# Patient Record
Sex: Male | Born: 1940
Health system: Southern US, Community
[De-identification: ages and names within clinical notes are randomized; demographics above are authoritative.]

## PROBLEM LIST (undated history)

## (undated) DIAGNOSIS — J309 Allergic rhinitis, unspecified: Secondary | ICD-10-CM

## (undated) DIAGNOSIS — E785 Hyperlipidemia, unspecified: Secondary | ICD-10-CM

## (undated) DIAGNOSIS — H269 Unspecified cataract: Secondary | ICD-10-CM

## (undated) DIAGNOSIS — K649 Unspecified hemorrhoids: Secondary | ICD-10-CM

## (undated) DIAGNOSIS — G4700 Insomnia, unspecified: Secondary | ICD-10-CM

## (undated) HISTORY — PX: CHOLECYSTECTOMY: SHX55

## (undated) HISTORY — DX: Unspecified hemorrhoids: K64.9

## (undated) HISTORY — DX: Allergic rhinitis, unspecified: J30.9

## (undated) HISTORY — PX: ROTATOR CUFF REPAIR: SHX139

## (undated) HISTORY — DX: Insomnia, unspecified: G47.00

## (undated) HISTORY — DX: Hyperlipidemia, unspecified: E78.5

## (undated) HISTORY — DX: Unspecified cataract: H26.9

---

## 2006-03-26 ENCOUNTER — Encounter: Admission: RE | Admit: 2006-03-26 | Discharge: 2006-03-26 | Payer: Self-pay | Admitting: Gastroenterology

## 2007-08-23 ENCOUNTER — Ambulatory Visit (HOSPITAL_COMMUNITY): Admission: RE | Admit: 2007-08-23 | Discharge: 2007-08-23 | Payer: Self-pay | Admitting: Gastroenterology

## 2007-09-14 ENCOUNTER — Ambulatory Visit (HOSPITAL_COMMUNITY): Admission: RE | Admit: 2007-09-14 | Discharge: 2007-09-14 | Payer: Self-pay | Admitting: Gastroenterology

## 2010-03-06 ENCOUNTER — Inpatient Hospital Stay (HOSPITAL_COMMUNITY): Admission: EM | Admit: 2010-03-06 | Discharge: 2010-03-09 | Payer: Self-pay | Source: Home / Self Care

## 2010-03-07 ENCOUNTER — Encounter (INDEPENDENT_AMBULATORY_CARE_PROVIDER_SITE_OTHER): Payer: Self-pay

## 2010-04-01 ENCOUNTER — Emergency Department (HOSPITAL_COMMUNITY)
Admission: EM | Admit: 2010-04-01 | Discharge: 2010-04-01 | Payer: Self-pay | Source: Home / Self Care | Admitting: Emergency Medicine

## 2010-04-07 LAB — BASIC METABOLIC PANEL
BUN: 13 mg/dL (ref 6–23)
CO2: 24 mEq/L (ref 19–32)
Calcium: 9.2 mg/dL (ref 8.4–10.5)
Chloride: 104 mEq/L (ref 96–112)
Creatinine, Ser: 1.02 mg/dL (ref 0.4–1.5)
GFR calc Af Amer: >60 mL/min (ref >60)
GFR calc non Af Amer: >60 mL/min (ref >60)
Glucose, Bld: 117 mg/dL — ABNORMAL HIGH (ref 70–99)
Potassium: 3.8 mEq/L (ref 3.5–5.1)
Sodium: 138 mEq/L (ref 135–145)

## 2010-04-07 LAB — DIFFERENTIAL
Basophils Absolute: 0.1 10*3/uL (ref 0.0–0.1)
Basophils Relative: 1 % (ref 0–1)
Eosinophils Absolute: 0.5 10*3/uL (ref 0.0–0.7)
Eosinophils Relative: 6 % — ABNORMAL HIGH (ref 0–5)
Lymphocytes Relative: 23 % (ref 12–46)
Lymphs Abs: 1.8 10*3/uL (ref 0.7–4.0)
Monocytes Absolute: 0.4 10*3/uL (ref 0.1–1.0)
Monocytes Relative: 5 % (ref 3–12)
Neutro Abs: 4.9 10*3/uL (ref 1.7–7.7)
Neutrophils Relative %: 64 % (ref 43–77)

## 2010-04-07 LAB — CBC
HCT: 39.4 % (ref 39.0–52.0)
Hemoglobin: 13.4 g/dL (ref 13.0–17.0)
MCH: 30.9 pg (ref 26.0–34.0)
MCHC: 34 g/dL (ref 30.0–36.0)
MCV: 90.8 fL (ref 78.0–100.0)
Platelets: 296 10*3/uL (ref 150–400)
RBC: 4.34 MIL/uL (ref 4.22–5.81)
RDW: 12.9 % (ref 11.5–15.5)
WBC: 7.6 10*3/uL (ref 4.0–10.5)

## 2010-04-17 ENCOUNTER — Encounter
Admission: RE | Admit: 2010-04-17 | Discharge: 2010-04-17 | Payer: Self-pay | Source: Home / Self Care | Attending: Gastroenterology | Admitting: Gastroenterology

## 2010-05-19 ENCOUNTER — Ambulatory Visit (HOSPITAL_COMMUNITY)
Admission: RE | Admit: 2010-05-19 | Discharge: 2010-05-19 | Disposition: A | Discharge status: Home / Self Care | Payer: Medicare Other | Source: Ambulatory Visit | Attending: Gastroenterology | Admitting: Gastroenterology

## 2010-05-19 DIAGNOSIS — R1310 Dysphagia, unspecified: Secondary | ICD-10-CM | POA: Insufficient documentation

## 2010-05-19 DIAGNOSIS — K219 Gastro-esophageal reflux disease without esophagitis: Secondary | ICD-10-CM | POA: Insufficient documentation

## 2010-06-02 LAB — COMPREHENSIVE METABOLIC PANEL
ALT: 53 U/L (ref 0–53)
AST: 44 U/L — ABNORMAL HIGH (ref 0–37)
Albumin: 3.3 g/dL — ABNORMAL LOW (ref 3.5–5.2)
Alkaline Phosphatase: 120 U/L — ABNORMAL HIGH (ref 39–117)
BUN: 8 mg/dL (ref 6–23)
CO2: 25 mEq/L (ref 19–32)
Calcium: 8.6 mg/dL (ref 8.4–10.5)
Chloride: 100 mEq/L (ref 96–112)
Creatinine, Ser: 1.03 mg/dL (ref 0.4–1.5)
GFR calc Af Amer: >60 mL/min (ref >60)
GFR calc non Af Amer: >60 mL/min (ref >60)
Glucose, Bld: 142 mg/dL — ABNORMAL HIGH (ref 70–99)
Potassium: 3.9 mEq/L (ref 3.5–5.1)
Sodium: 133 mEq/L — ABNORMAL LOW (ref 135–145)
Total Bilirubin: 1.1 mg/dL (ref 0.3–1.2)
Total Protein: 6.4 g/dL (ref 6.0–8.3)

## 2010-06-02 LAB — DIFFERENTIAL
Basophils Absolute: 0 10*3/uL (ref 0.0–0.1)
Basophils Relative: 0 % (ref 0–1)
Eosinophils Absolute: 0.1 10*3/uL (ref 0.0–0.7)
Eosinophils Relative: 1 % (ref 0–5)
Lymphocytes Relative: 8 % — ABNORMAL LOW (ref 12–46)
Lymphs Abs: 0.9 10*3/uL (ref 0.7–4.0)
Monocytes Absolute: 0.6 10*3/uL (ref 0.1–1.0)
Monocytes Relative: 6 % (ref 3–12)
Neutro Abs: 8.7 10*3/uL — ABNORMAL HIGH (ref 1.7–7.7)
Neutrophils Relative %: 84 % — ABNORMAL HIGH (ref 43–77)

## 2010-06-02 LAB — CBC
HCT: 37.6 % — ABNORMAL LOW (ref 39.0–52.0)
HCT: 38.4 % — ABNORMAL LOW (ref 39.0–52.0)
Hemoglobin: 12.9 g/dL — ABNORMAL LOW (ref 13.0–17.0)
Hemoglobin: 13.2 g/dL (ref 13.0–17.0)
MCH: 31.1 pg (ref 26.0–34.0)
MCH: 31.2 pg (ref 26.0–34.0)
MCHC: 34.3 g/dL (ref 30.0–36.0)
MCHC: 34.4 g/dL (ref 30.0–36.0)
MCV: 90.6 fL (ref 78.0–100.0)
MCV: 90.8 fL (ref 78.0–100.0)
Platelets: 240 10*3/uL (ref 150–400)
Platelets: 248 10*3/uL (ref 150–400)
RBC: 4.15 MIL/uL — ABNORMAL LOW (ref 4.22–5.81)
RBC: 4.23 MIL/uL (ref 4.22–5.81)
RDW: 12.7 % (ref 11.5–15.5)
RDW: 12.9 % (ref 11.5–15.5)
WBC: 10.3 10*3/uL (ref 4.0–10.5)
WBC: 11.8 10*3/uL — ABNORMAL HIGH (ref 4.0–10.5)

## 2010-06-02 LAB — POCT I-STAT, CHEM 8
BUN: 11 mg/dL (ref 6–23)
Calcium, Ion: 1.04 mmol/L — ABNORMAL LOW (ref 1.12–1.32)
Chloride: 99 mEq/L (ref 96–112)
Creatinine, Ser: 1.1 mg/dL (ref 0.4–1.5)
Glucose, Bld: 141 mg/dL — ABNORMAL HIGH (ref 70–99)
HCT: 41 % (ref 39.0–52.0)
Hemoglobin: 13.9 g/dL (ref 13.0–17.0)
Potassium: 3.9 mEq/L (ref 3.5–5.1)
Sodium: 133 mEq/L — ABNORMAL LOW (ref 135–145)
TCO2: 26 mmol/L (ref 0–100)

## 2010-06-02 LAB — COMPREHENSIVE METABOLIC PANEL WITH GFR
ALT: 50 U/L (ref 0–53)
AST: 42 U/L — ABNORMAL HIGH (ref 0–37)
Albumin: 3.2 g/dL — ABNORMAL LOW (ref 3.5–5.2)
Alkaline Phosphatase: 129 U/L — ABNORMAL HIGH (ref 39–117)
BUN: 7 mg/dL (ref 6–23)
CO2: 26 meq/L (ref 19–32)
Calcium: 8.8 mg/dL (ref 8.4–10.5)
Chloride: 99 meq/L (ref 96–112)
Creatinine, Ser: 0.87 mg/dL (ref 0.4–1.5)
GFR calc non Af Amer: >60 mL/min
Glucose, Bld: 169 mg/dL — ABNORMAL HIGH (ref 70–99)
Potassium: 3.8 meq/L (ref 3.5–5.1)
Sodium: 134 meq/L — ABNORMAL LOW (ref 135–145)
Total Bilirubin: 0.8 mg/dL (ref 0.3–1.2)
Total Protein: 6.3 g/dL (ref 6.0–8.3)

## 2010-06-02 LAB — AMYLASE: Amylase: 56 U/L (ref 0–105)

## 2010-06-02 LAB — LIPASE, BLOOD
Lipase: 17 U/L (ref 11–59)
Lipase: 17 U/L (ref 11–59)

## 2010-06-17 NOTE — Op Note (Signed)
  NAMEHELIX, LAFONTAINE NO.:  [id number]  MEDICAL RECORD NO.:  [id number]           PATIENT TYPE:  O  LOCATION:  WLEN                         FACILITY:  [facility]  PHYSICIAN:  Petra Kuba, M.D.    DATE OF BIRTH:  04-Oct-1940  DATE OF PROCEDURE:  05/19/2010 DATE OF DISCHARGE:  05/19/2010                              OPERATIVE REPORT   PROCEDURE:  The manometry was done by the endoscopy nurse and the computer generated report was given to me for my interpretation.  HISTORY:  Patient with history of hiatal hernia and reflux, contemplating hiatal hernia repair.  MANOMETRY RESULTS: 1. Upper esophageal sphincter did have slight increased pressure and     slight residual pressure, but the pharyngeal pressure and waveforms     were fine. 2. Esophageal body had normal waveforms, normal amplitude, normal     duration and normal velocity with 100% peristalsis and propagated     waves. 3. Lower esophageal sphincter had normal pressures and normal percent     relaxation as well as normal residual pressures.  IMPRESSION:  Okay for surgical options.  PLAN:  Per Dr. Dwain Sarna.  Happy to see back p.r.n. and consider just treating medically and holding off on surgery but will leave to the patient and surgeon.          ______________________________ Petra Kuba, M.D.     MEM/MEDQ  D:  05/28/2010  T:  05/29/2010  Job:  [id number]  cc:   Juanetta Gosling, MD [address] [place] [address]  Electronically Signed by Vida Rigger M.D. on 06/17/2010 01:22:03 PM

## 2010-08-05 NOTE — Op Note (Signed)
Marcus Hopkins, Marcus Hopkins NO.:  [id number]   MEDICAL RECORD NO.:  [id number]          PATIENT TYPE:  AMB   LOCATION:  ENDO                         FACILITY:  [facility]   PHYSICIAN:  Danise Edge, M.D.   DATE OF BIRTH:  Oct 20, 1940   DATE OF PROCEDURE:  09/14/2007  DATE OF DISCHARGE:                               OPERATIVE REPORT   REFERRING PHYSICIAN:  Thora Lance, MD   PROCEDURE INDICATION:  Mr. Marcus Hopkins is a 70 year old male born  November 20, 1940.  On August 23, 2007, Mr. Marcus Hopkins swallowed a piece of  meat which lodged in his distal esophagus.  The obstructing meat bolus  was removed with the Roth net.  The distal esophagus was severely  inflamed as a result of the foreign body.  Mr. Marcus Hopkins has been on a  proton pump inhibitor and returns today for esophageal dilation.   MEDICATION ALLERGIES:  None.   CHRONIC MEDICATIONS:  None.   PAST MEDICAL - SURGICAL HISTORY:  Allergic rhinitis.   FAMILY HISTORY:  Negative for colon cancer.   HABITS:  Mr. Marcus Hopkins quit smoking cigarettes years ago.  He consumes  alcohol in moderation.   ENDOSCOPY:  Danise Edge, MD   PREMEDICATION:  1. Fentanyl 75 mcg.  2. Versed 9 mg.   PROCEDURE:  After obtaining informed consent, Mr. Marcus Hopkins was placed in  the left lateral decubitus position on the fluoroscopy table.  I  administered intravenous fentanyl and intravenous Versed to achieve  conscious sedation for the procedure.  The patient's blood pressure,  oxygen saturation and cardiac rhythm were monitored throughout the  procedure and documented in the medical record.   The Pentax gastroscope was passed through the posterior hypopharynx into  the proximal esophagus without difficulty.  The hypopharynx, larynx and  vocal cords appeared normal.   Esophagoscopy:  The proximal, mid and lower segments of the esophageal  mucosa appear normal except for a shallow benign stricture at the  esophagogastric junction which is  noted at approximately 44 cm from the  incisor teeth.  There is no endoscopic evidence for the presence of  Barrett's esophagus, erosive esophagitis or esophageal tumor.   Gastroscopy:  Retroflex view of the gastric cardia and fundus was  normal.  I do not detect a hiatal hernia.  The gastric body, antrum and  pylorus appear completely normal.   Duodenoscopy:  The duodenal bulb and descending duodenum appeared  normal.   Savary esophageal dilation:  The Savary dilator wire was passed through  the Pentax gastroscope and the tip of the guidewire advanced to the  distal gastric antrum.  Fluoroscopy was not required for esophageal  dilation.  The 15-mm Savary dilator was passed without resistance over  the Savary guidewire.  Repeat esophagogastroscopy following Savary  esophageal dilation reveals satisfactory dilation of the benign peptic  stricture at the esophagogastric junction and no gastric trauma due to  the guidewire.   ASSESSMENT:  Shallow benign peptic stricture at the esophagogastric  junction dilated with the 15-mm Savary dilator.  Otherwise, normal  esophagogastroduodenoscopy.  ______________________________  Danise Edge, M.D.     MJ/MEDQ  D:  09/14/2007  T:  09/15/2007  Job:  [id number]   cc:   Thora Lance, M.D.

## 2010-08-05 NOTE — Op Note (Signed)
[name], [name] NO.:  [id number]   MEDICAL RECORD NO.:  [id number]          PATIENT TYPE:  AMB   LOCATION:  ENDO                         FACILITY:  [facility]   PHYSICIAN:  Danise Edge, M.D.   DATE OF BIRTH:  [birth year]   DATE OF PROCEDURE:  08/23/2007  DATE OF DISCHARGE:                               OPERATIVE REPORT   REFERRING PHYSICIAN:  Thora Lance, MD.   PROCEDURE INDICATIONS:  Marcus Hopkins is a 70 year old male, born  on 05-19-1940.  Marcus Hopkins was consuming a steak tender last  night and a piece of meat became lodged in the esophagus.  He was able  to regurgitate part of the obstructing meat bolus.  He has not had any  food were drained and having the obstruction developed.  He feels as  though there is residual food lodged in the esophagus.   In January 2008, his barium swallow with barium tablet was normal except  for a nonobstructing cervical esophageal ring.   ENDOSCOPIST:  Danise Edge, MD   PREMEDICATION:  Fentanyl 100 mcg and Versed 7.5 mg.   PROCEDURE:  After obtaining informed consent, Marcus Hopkins was placed in  the left lateral decubitus position on the fluoroscopy table.  I  administered intravenous fentanyl and intravenous Versed to achieve  conscious sedation for the procedure.  The patient's blood pressure,  oxygen saturation, and cardiac rhythm were monitored throughout the  procedure and documented in the medical record.   The Pentax gastroscope was passed through the posterior hypopharynx into  the proximal esophagus without difficulty.  The hypopharynx, larynx, and  vocal cords appeared normal.   Esophagoscopy:  The proximal and mid segments of the esophagus appeared  normal.  There was meat completely obstructing the distal esophagus at  the esophagogastric junction.  The meat was pulled out of the  esophagogastric junction using the Roth net and removed through the  mouth with the gastroscope.  The Pentax  gastroscope was then passed  through the posterior hypopharynx into the esophagus without difficulty.  The proximal mid and lower segments of the esophagus appeared normal  except for a shallow Schatzki ring at the esophagogastric junction (40  cm from the incisor teeth) associated with severe mucosal friability  where the meat had caused the esophageal obstruction.   Gastroscopy:  Retroflex view of the gastric cardia and fundus was  normal.  The gastric body, antrum, and pylorus appeared normal.   Duodenoscopy:  The duodenal bulb and descending duodenum appeared  normal.   ASSESSMENT:  Meat bolus obstructing the distal esophagus; the  obstructing food was removed with the Roth met.  Shallow Schatzki ring  at the esophagogastric junction.  Severe distal esophageal mucosal  friability due to injury from the obstructing meat bolus.   PLAN:  I will allow the esophagus to heal and perform an elective  esophageal dilation in the next week or two.           ______________________________  Danise Edge, M.D.     MJ/MEDQ  D:  08/23/2007  T:  08/24/2007  Job:  [id number]   cc:   Thora Lance, M.D.

## 2011-11-29 ENCOUNTER — Ambulatory Visit (HOSPITAL_COMMUNITY)
Admission: EM | Admit: 2011-11-29 | Discharge: 2011-11-29 | Disposition: A | Discharge status: Home / Self Care | Payer: Medicare Other | Attending: Emergency Medicine | Admitting: Emergency Medicine

## 2011-11-29 ENCOUNTER — Encounter (HOSPITAL_COMMUNITY): Payer: Self-pay | Admitting: Emergency Medicine

## 2011-11-29 ENCOUNTER — Encounter (HOSPITAL_COMMUNITY)
Admission: EM | Disposition: A | Discharge status: Home / Self Care | Payer: Self-pay | Source: Home / Self Care | Attending: Emergency Medicine

## 2011-11-29 DIAGNOSIS — R6889 Other general symptoms and signs: Secondary | ICD-10-CM | POA: Insufficient documentation

## 2011-11-29 DIAGNOSIS — IMO0002 Reserved for concepts with insufficient information to code with codable children: Secondary | ICD-10-CM | POA: Insufficient documentation

## 2011-11-29 DIAGNOSIS — T18108A Unspecified foreign body in esophagus causing other injury, initial encounter: Secondary | ICD-10-CM | POA: Insufficient documentation

## 2011-11-29 DIAGNOSIS — R1310 Dysphagia, unspecified: Secondary | ICD-10-CM | POA: Insufficient documentation

## 2011-11-29 DIAGNOSIS — K117 Disturbances of salivary secretion: Secondary | ICD-10-CM | POA: Insufficient documentation

## 2011-11-29 HISTORY — PX: ESOPHAGOGASTRODUODENOSCOPY: SHX5428

## 2011-11-29 SURGERY — EGD (ESOPHAGOGASTRODUODENOSCOPY)
Anesthesia: Moderate Sedation

## 2011-11-29 MED ORDER — GLUCAGON HCL (RDNA) 1 MG IJ SOLR
INTRAMUSCULAR | Status: DC | PRN
  Administered 2011-11-29: .5 mg via INTRAVENOUS

## 2011-11-29 MED ORDER — BUTAMBEN-TETRACAINE-BENZOCAINE 2-2-14 % EX AERO
INHALATION_SPRAY | CUTANEOUS | Status: DC | PRN
  Administered 2011-11-29: 1 via TOPICAL

## 2011-11-29 MED ORDER — GLUCAGON HCL (RDNA) 1 MG IJ SOLR
1.0000 mg | Freq: Once | INTRAMUSCULAR | Status: AC
  Administered 2011-11-29: 1 mg via INTRAVENOUS
  Filled 2011-11-29: qty 1

## 2011-11-29 MED ORDER — MIDAZOLAM HCL 10 MG/2ML IJ SOLN
INTRAMUSCULAR | Status: DC | PRN
  Administered 2011-11-29 (×2): 2 mg via INTRAVENOUS

## 2011-11-29 MED ORDER — SODIUM CHLORIDE 0.9 % IV SOLN: INTRAVENOUS | Status: DC

## 2011-11-29 MED ORDER — SODIUM CHLORIDE 0.9 % IV SOLN
INTRAVENOUS | Status: DC
  Administered 2011-11-29: 04:00:00 via INTRAVENOUS

## 2011-11-29 MED ORDER — FENTANYL CITRATE 0.05 MG/ML IJ SOLN
INTRAMUSCULAR | Status: DC | PRN
  Administered 2011-11-29: 50 ug via INTRAVENOUS
  Administered 2011-11-29: 25 ug via INTRAVENOUS

## 2011-11-29 NOTE — H&P (Signed)
[facility] Consult Note  Consulting Physician:  Dr. Dierdre Highman (ED)  Chief Complaint: sialorrhea, dysphagia, suspected esophageal food impaction.  HPI: Marcus Hopkins is an 71 y.o. male presenting with dysphagia, sialorrhea, and sensation of steak stuck in his esophagus.  Has happened couple times in past.  Reportedly has had dilatation, and has had couple endoscopies for food impactions.    History reviewed. No pertinent past medical history.  Past Surgical History  Procedure Date  . Cholecystectomy     No prescriptions prior to admission    Allergies: No Known Allergies  No family history on file.  Social History:  reports that he has never smoked. He does not have any smokeless tobacco history on file. He reports that he does not drink alcohol. His drug history not on file.   ROS: As per HPI, all others negative  Blood pressure 130/85, pulse 75, temperature 98 F (36.7 C), resp. rate 17, height 5\' 9"  (1.753 m), weight 74.844 kg (165 lb), SpO2 97.00%. General appearance: NAD, some sialorrhea HEENT:  Anicteric Chest:  No crepitus or tenderness ABD:  Soft Neuro:  Alert + oriented  No results found for this or any previous visit (from the past 48 hour(s)). No results found.  Assessment:  1.  Suspected esophageal food impaction.  Plan: 1.  Upper endoscopy with likely foreign body extraction. 2.  Risks (bleeding, infection, bowel perforation that could require surgery, sedation-related changes in cardiopulmonary systems), benefits (identification and possible treatment of source of symptoms, exclusion of certain causes of symptoms), and alternatives (watchful waiting, radiographic imaging studies, empiric medical treatment) of upper endoscopy (EGD) were explained to patient in detail and he wishes to proceed.  [name] 11/29/2011, 5:11 AM

## 2011-11-29 NOTE — ED Notes (Signed)
Pt alert, arrives from home, c/o "steak stuck" in throat, onset this evening, hx of same, no stridor noted, tolerating oral secretions well

## 2011-11-29 NOTE — ED Notes (Signed)
Received patient back from Endoscopy. Patient alert and oriented. Awaiting arrival of spouse for transportation home.

## 2011-11-29 NOTE — ED Notes (Signed)
Patient transported to Endoscopy

## 2011-11-29 NOTE — ED Notes (Signed)
[id number]<MV> Expected date:<BR> Expected time:<BR> Means of arrival:<BR> Comments:<BR> Patient is in ENDO

## 2011-11-29 NOTE — Brief Op Note (Signed)
EGD done; report in Endopro.  Food impaction seen and endoscopically removed.  Ok to discharge home.  We will arrange outpatient follow-up.

## 2011-11-29 NOTE — Op Note (Signed)
[facility] [address] [place], [address]   ENDOSCOPY PROCEDURE REPORT  PATIENT: Marcus Hopkins, Marcus Hopkins  MR#: [id number] BIRTHDATE: January 02, 1941 , 70  yrs. old GENDER: Male ENDOSCOPIST: Willis Modena, MD REFERRED BY:  Kirby Funk, M.D. PROCEDURE DATE:  11/29/2011 PROCEDURE:  EGD w/ fb removal ASA CLASS:     Class I INDICATIONS:  foreign body removal from esophagus.   dysphagia. sialorrhea. MEDICATIONS: Glucagon 0.5 mg IV, Fentanyl 75 mcg IV, and Versed 5 mg IV TOPICAL ANESTHETIC: Cetacaine Spray  DESCRIPTION OF PROCEDURE: After the risks benefits and alternatives of the procedure were thoroughly explained, informed consent was obtained.  The Pentax Gastroscope [id number] endoscope was introduced through the mouth and advanced to the second portion of the duodenum. Without limitations.  The instrument was slowly withdrawn as the mucosa was fully examined.     Findings: Food bolus seen wedged in distal esophagus.  Large portion of bolus was grasped with talon grasper and removed via mouth.  The remainder of the bolus, small, was gently nudged into the stomach. There was a muscular-appearing ring in the distal esopahgus; appearance not obvious of Schatzki's ring; no mucosal features to suggest eosinophilic esophagitis were identified.  Limited endoscopy of stomach, pylorus and duodenum to the second portion was normal.              The scope was then withdrawn from the patient and the procedure completed.  ENDOSCOPIC IMPRESSION:     As above.  RECOMMENDATIONS:     1.  Watch for potential complications of procedure. 2.  Continue Prilosec OTC 20 mg once-a-day. 3.  Avoid fibrous meats, fibrous/raw vegetables and fibrous breads for the next couple weeks. 4.  Will need repeat endoscopy + esophageal dilatation in the next couple weeks, either by myself or by Dr. Ewing Schlein, both of whom have now seen patient for esophageal food impactions.  eSigned:  Willis Modena, MD  11/29/2011 5:47 AM   CC:

## 2011-11-29 NOTE — ED Provider Notes (Signed)
History     CSN: [id number]  Arrival date & time 11/29/11  0205   First MD Initiated Contact with Patient 11/29/11 0315      Chief Complaint  Patient presents with  . Airway Obstruction    (Consider location/radiation/quality/duration/timing/severity/associated sxs/prior treatment) HPI HX per PT, feel slike something stuck in his esophagus, h/o same in the past with stricture requiring dilation, followed by GI, [facility].  Tonight eating steak and feels stuck, unable to swallow liquids and spitting up saliva. Mild epigastric discomfort, no SOB. No F/C> mod in severity. H/o same.  History reviewed. No pertinent past medical history.  Past Surgical History  Procedure Date  . Cholecystectomy   . Rotator cuff repair     No family history on file.  History  Substance Use Topics  . Smoking status: Never Smoker   . Smokeless tobacco: Not on file  . Alcohol Use: No      Review of Systems  Constitutional: Negative for fever and chills.  HENT: Negative for neck pain and neck stiffness.   Eyes: Negative for pain.  Respiratory: Negative for shortness of breath.   Cardiovascular: Negative for chest pain.  Gastrointestinal: Negative for abdominal distention.  Genitourinary: Negative for dysuria.  Musculoskeletal: Negative for back pain.  Skin: Negative for rash.  Neurological: Negative for headaches.  All other systems reviewed and are negative.    Allergies  Review of patient's allergies indicates no known allergies.  Home Medications  No current outpatient prescriptions on file.  BP 123/77  Pulse 75  Temp 98 F (36.7 C)  Resp 20  Ht 5\' 9"  (1.753 m)  Wt 165 lb (74.844 kg)  BMI 24.37 kg/m2  SpO2 95%  Physical Exam  Constitutional: He is oriented to person, place, and time. He appears well-developed and well-nourished.  HENT:  Head: Normocephalic and atraumatic.  Eyes: Conjunctivae and EOM are normal. Pupils are equal, round, and reactive to light.  Neck: Trachea  normal. Neck supple. No thyromegaly present.  Cardiovascular: Normal rate, regular rhythm, S1 normal, S2 normal and normal pulses.     No systolic murmur is present   No diastolic murmur is present  Pulses:      Radial pulses are 2+ on the right side, and 2+ on the left side.  Pulmonary/Chest: Effort normal and breath sounds normal. He has no wheezes. He has no rhonchi. He has no rales. He exhibits no tenderness.  Abdominal: Soft. Normal appearance and bowel sounds are normal. There is no tenderness. There is no CVA tenderness and negative Murphy's sign.  Musculoskeletal:       BLE:s Calves nontender, no cords or erythema, negative Homans sign  Neurological: He is alert and oriented to person, place, and time. He has normal strength. No cranial nerve deficit or sensory deficit. GCS eye subscore is 4. GCS verbal subscore is 5. GCS motor subscore is 6.  Skin: Skin is warm and dry. No rash noted. He is not diaphoretic.  Psychiatric: His speech is normal.       Cooperative and appropriate    ED Course  Procedures (including critical care time)  IVfs and IV glucagton  D/w Dr Dulce Sellar, GI, who presented to the ED, was able to resolve food bolus and PT sent home in the care of his wife. Has GI f/u [facility].   6:15 AM feeling much better. Stable for d/c home  MDM   VS, nursing notes and old records reviewed. GI c/s and food bolus resolved.  IVFs.         Sunnie Nielsen, MD 11/29/11 [id number]

## 2011-11-29 NOTE — ED Notes (Signed)
Patient attempted to drink water. Attempt unsuccessful. Patient regurgitated water. States that he can still feel food bolus.

## 2011-11-30 ENCOUNTER — Encounter (HOSPITAL_COMMUNITY): Payer: Self-pay | Admitting: Gastroenterology

## 2013-10-15 ENCOUNTER — Encounter (HOSPITAL_COMMUNITY): Payer: Self-pay | Admitting: Emergency Medicine

## 2013-10-15 ENCOUNTER — Emergency Department (HOSPITAL_COMMUNITY)
Admission: EM | Admit: 2013-10-15 | Discharge: 2013-10-15 | Disposition: A | Discharge status: Home / Self Care | Payer: Medicare PPO | Source: Home / Self Care

## 2013-10-15 DIAGNOSIS — S46912A Strain of unspecified muscle, fascia and tendon at shoulder and upper arm level, left arm, initial encounter: Secondary | ICD-10-CM

## 2013-10-15 DIAGNOSIS — S46212A Strain of muscle, fascia and tendon of other parts of biceps, left arm, initial encounter: Secondary | ICD-10-CM

## 2013-10-15 DIAGNOSIS — X58XXXA Exposure to other specified factors, initial encounter: Secondary | ICD-10-CM

## 2013-10-15 DIAGNOSIS — S43499A Other sprain of unspecified shoulder joint, initial encounter: Secondary | ICD-10-CM

## 2013-10-15 DIAGNOSIS — M7522 Bicipital tendinitis, left shoulder: Secondary | ICD-10-CM

## 2013-10-15 DIAGNOSIS — IMO0002 Reserved for concepts with insufficient information to code with codable children: Secondary | ICD-10-CM

## 2013-10-15 DIAGNOSIS — M7520 Bicipital tendinitis, unspecified shoulder: Secondary | ICD-10-CM

## 2013-10-15 DIAGNOSIS — S46819A Strain of other muscles, fascia and tendons at shoulder and upper arm level, unspecified arm, initial encounter: Secondary | ICD-10-CM

## 2013-10-15 MED ORDER — DICLOFENAC POTASSIUM 50 MG PO TABS: 50.0000 mg | ORAL_TABLET | Freq: Three times a day (TID) | ORAL | Status: DC

## 2013-10-15 NOTE — ED Notes (Signed)
Pt  States  He  Was  Lifting  A  Person  Up  And  Felt  A          Pain in left  forearm and  Elbow  Area          No  Obvious  Deformity        rom is  Present         Hand   Grip   Mod    Cap refill is  Brisk     No other  injurys

## 2013-10-15 NOTE — ED Provider Notes (Signed)
CSN: [id number]     Arrival date & time 10/15/13  1342 History   First MD Initiated Contact with Patient 10/15/13 1444     Chief Complaint  Patient presents with  . Arm Injury   (Consider location/radiation/quality/duration/timing/severity/associated sxs/prior Treatment) HPI Comments: 73 y o m lifting his mother in law felt a pop and severe pain in the L arm at the A/C fossa approx 2 hours before being seen.  Points to the A/C aspect , distal bicep and proximal brachioradialis.    History reviewed. No pertinent past medical history. Past Surgical History  Procedure Laterality Date  . Cholecystectomy    . Rotator cuff repair    . Esophagogastroduodenoscopy  11/29/2011    Procedure: ESOPHAGOGASTRODUODENOSCOPY (EGD);  Surgeon: Arta Silence, MD;  Location: [facility];  Service: Endoscopy;  Laterality: N/A;   History reviewed. No pertinent family history. History  Substance Use Topics  . Smoking status: Never Smoker   . Smokeless tobacco: Not on file  . Alcohol Use: No    Review of Systems  Constitutional: Negative.   Respiratory: Negative.   Gastrointestinal: Negative.   Genitourinary: Negative.   Musculoskeletal: Negative for back pain, neck pain and neck stiffness.       As per HPI  Skin: Negative.   Neurological: Negative for dizziness, weakness, numbness and headaches.    Allergies  Review of patient's allergies indicates no known allergies.  Home Medications   Prior to Admission medications   Medication Sig Start Date End Date Taking? Authorizing Provider  diclofenac (CATAFLAM) 50 MG tablet Take 1 tablet (50 mg total) by mouth 3 (three) times daily. One tablet TID with food prn pain. 10/15/13   Janne Napoleon, NP   BP 138/96  Pulse 63  Temp(Src) 98.7 F (37.1 C) (Oral)  SpO2 96% Physical Exam  Nursing note and vitals reviewed. Constitutional: He is oriented to person, place, and time. He appears well-developed and well-nourished. No distress.  HENT:  Head:  Normocephalic and atraumatic.  Eyes: EOM are normal. Left eye exhibits no discharge.  Neck: Normal range of motion. Neck supple.  Pulmonary/Chest: Effort normal. No respiratory distress.  Musculoskeletal:  No deformity. Minor swelling distal bicep tendons and brachioradialis. No asymmetry. Bicep muscle intact and with minor tenderness.  Nl strength with flexion of bicep, pronation, suppination, grip, wrist ROM. Distal N/V, M/S intact.Pule 2+, nl color and warmth.  Neurological: He is alert and oriented to person, place, and time. No cranial nerve deficit.  Skin: Skin is warm and dry.  Psychiatric: He has a normal mood and affect.    ED Course  Procedures (including critical care time) Labs Review Labs Reviewed - No data to display  Imaging Review No results found.   MDM   1. Biceps strain, left, initial encounter   2. Biceps tendinitis, left   3. Muscle strain of upper arm, left, initial encounter     Wear your sling for 2-3 days, remove periodically for UE movement ICe as directed' [facility] as discussed.    Janne Napoleon, NP 10/15/13 [id number]

## 2013-10-15 NOTE — Discharge Instructions (Signed)
Bicipital Tendonitis Bicipital tendonitis refers to redness, soreness, and swelling (inflammation) or irritation of the bicep tendon. The biceps muscle is located between the elbow and shoulder of the inner arm. The tendon heads, similar to pieces of rope, connect the bicep muscle to the shoulder socket. They are called short head and long head tendons. When tendonitis occurs, the long head tendon is inflamed and swollen, and may be thickened or partially torn.  Bicipital tendonitis can occur with other problems as well, such as arthritis in the shoulder or acromioclavicular joints, tears in the tendons, or other rotator cuff problems.  CAUSES  Overuse of of the arms for overhead activities is the major cause of tendonitis. Many athletes, such as swimmers, baseball players, and tennis players are prone to bicipital tendonitis. Jobs that require manual labor or routine chores, especially chores involving overhead activities can result in overuse and tendonitis. SYMPTOMS Symptoms may include:  Pain in and around the front of the shoulder. Pain may be worse with overhead motion.  Pain or aching that radiates down the arm.  Clicking or shifting sensations in the shoulder. DIAGNOSIS Your caregiver may perform the following:  Physical exam and tests of the biceps and shoulder to observe range of motion, strength, and stability.  X-rays or magnetic resonance imaging (MRI) to confirm the diagnosis. In most common cases, these tests are not necessary. Since other problems may exist in the shoulder or rotator cuff, additional tests may be recommended. TREATMENT Treatment may include the following:  Medications  Your caregiver may prescribe over-the-counter pain relievers.  Steroid injections, such as cortisone, may be recommended. These may help to reduce inflammation and pain.  Physical Therapy - Your caregiver may recommend gentle exercises with the arm. These can help restore strength and range  of motion. They may be done at home or with a physical therapist's supervision and input.  Surgery - Arthroscopic or open surgery sometimes is necessary. Surgery may include:  Reattachment or repair of the tendon at the shoulder socket.  Removal of the damaged section of the tendon.  Anchoring the tendon to a different area of the shoulder (tenodesis). HOME CARE INSTRUCTIONS   Avoid overhead motion of the affected arm or any other motion that causes pain.  Take medication for pain as directed. Do not take these for more than 3 weeks, unless directed to do so by your caregiver.  Ice the affected area for 20 minutes at a time, 3-4 times per day. Place a towel on the skin over the painful area and the ice or cold pack over the towel. Do not place ice directly on the skin.  Perform gentle exercises at home as directed. These will increase strength and flexibility. PREVENTION  Modify your activities as much as possible to protect your arm. A physical therapist or sports medicine physician can help you understand options for safe motion.  Avoid repetitive overhead pulling, lifting, reaching, and throwing until your caregiver tells you it is ok to resume these activities. SEEK MEDICAL CARE IF:  Your pain worsens.  You have difficulty moving the affected arm.  You have trouble performing any of the self-care instructions. MAKE SURE YOU:   Understand these instructions.  Will watch your condition.  Will get help right away if you are not doing well or get worse. Document Released: 04/11/2010 Document Revised: 06/01/2011 Document Reviewed: 04/11/2010 [facility] Patient Information 2015 [place], [place]. This information is not intended to replace advice given to you by your health care provider.  Make sure you discuss any questions you have with your health care provider.  Muscle Strain A muscle strain is an injury that occurs when a muscle is stretched beyond its normal length. Usually a  small number of muscle fibers are torn when this happens. Muscle strain is rated in degrees. First-degree strains have the least amount of muscle fiber tearing and pain. Second-degree and third-degree strains have increasingly more tearing and pain.  Usually, recovery from muscle strain takes 1-2 weeks. Complete healing takes 5-6 weeks.  CAUSES  Muscle strain happens when a sudden, violent force placed on a muscle stretches it too far. This may occur with lifting, sports, or a fall.  RISK FACTORS Muscle strain is especially common in athletes.  SIGNS AND SYMPTOMS At the site of the muscle strain, there may be:  Pain.  Bruising.  Swelling.  Difficulty using the muscle due to pain or lack of normal function. DIAGNOSIS  Your health care provider will perform a physical exam and ask about your medical history. TREATMENT  Often, the best treatment for a muscle strain is resting, icing, and applying cold compresses to the injured area.  HOME CARE INSTRUCTIONS   Use the PRICE method of treatment to promote muscle healing during the first 2-3 days after your injury. The PRICE method involves:  Protecting the muscle from being injured again.  Restricting your activity and resting the injured body part.  Icing your injury. To do this, put ice in a plastic bag. Place a towel between your skin and the bag. Then, apply the ice and leave it on from 15-20 minutes each hour. After the third day, switch to moist heat packs.  Apply compression to the injured area with a splint or elastic bandage. Be careful not to wrap it too tightly. This may interfere with blood circulation or increase swelling.  Elevate the injured body part above the level of your heart as often as you can.  Only take over-the-counter or prescription medicines for pain, discomfort, or fever as directed by your health care provider.  Warming up prior to exercise helps to prevent future muscle strains. SEEK MEDICAL CARE IF:    You have increasing pain or swelling in the injured area.  You have numbness, tingling, or a significant loss of strength in the injured area. MAKE SURE YOU:   Understand these instructions.  Will watch your condition.  Will get help right away if you are not doing well or get worse. Document Released: 03/09/2005 Document Revised: 12/28/2012 Document Reviewed: 10/06/2012 [facility] Patient Information 2015 [place], [place]. This information is not intended to replace advice given to you by your health care provider. Make sure you discuss any questions you have with your health care provider.  Tendinitis Tendinitis is swelling and inflammation of the tendons. Tendons are band-like tissues that connect muscle to bone. Tendinitis commonly occurs in the:   Shoulders (rotator cuff).  Heels (Achilles tendon).  Elbows (triceps tendon). CAUSES Tendinitis is usually caused by overusing the tendon, muscles, and joints involved. When the tissue surrounding a tendon (synovium) becomes inflamed, it is called tenosynovitis. Tendinitis commonly develops in people whose jobs require repetitive motions. SYMPTOMS  Pain.  Tenderness.  Mild swelling. DIAGNOSIS Tendinitis is usually diagnosed by physical exam. Your health care provider may also order X-rays or other imaging tests. TREATMENT Your health care provider may recommend certain medicines or exercises for your treatment. HOME CARE INSTRUCTIONS   Use a sling or splint for as long as directed by  your health care provider until the pain decreases.  Put ice on the injured area.  Put ice in a plastic bag.  Place a towel between your skin and the bag.  Leave the ice on for 15-20 minutes, 3-4 times a day, or as directed by your health care provider.  Avoid using the limb while the tendon is painful. Perform gentle range of motion exercises only as directed by your health care provider. Stop exercises if pain or discomfort increase, unless  directed otherwise by your health care provider.  Only take over-the-counter or prescription medicines for pain, discomfort, or fever as directed by your health care provider. SEEK MEDICAL CARE IF:   Your pain and swelling increase.  You develop new, unexplained symptoms, especially increased numbness in the hands. MAKE SURE YOU:   Understand these instructions.  Will watch your condition.  Will get help right away if you are not doing well or get worse. Document Released: 03/06/2000 Document Revised: 07/24/2013 Document Reviewed: 05/26/2010 [facility] Patient Information 2015 [place], [place]. This information is not intended to replace advice given to you by your health care provider. Make sure you discuss any questions you have with your health care provider.

## 2013-10-16 ENCOUNTER — Encounter: Payer: Self-pay | Admitting: *Deleted

## 2013-10-19 NOTE — ED Provider Notes (Signed)
Medical screening examination/treatment/procedure(s) were performed by a resident physician or non-physician practitioner and as the supervising physician I was immediately available for consultation/collaboration.  Lynne Leader, MD    Gregor Hams, MD 10/19/13 [phone]

## 2015-03-27 DIAGNOSIS — J301 Allergic rhinitis due to pollen: Secondary | ICD-10-CM | POA: Diagnosis not present

## 2015-03-27 DIAGNOSIS — J3081 Allergic rhinitis due to animal (cat) (dog) hair and dander: Secondary | ICD-10-CM | POA: Diagnosis not present

## 2015-03-27 DIAGNOSIS — J3089 Other allergic rhinitis: Secondary | ICD-10-CM | POA: Diagnosis not present

## 2015-04-03 DIAGNOSIS — J3089 Other allergic rhinitis: Secondary | ICD-10-CM | POA: Diagnosis not present

## 2015-04-03 DIAGNOSIS — J301 Allergic rhinitis due to pollen: Secondary | ICD-10-CM | POA: Diagnosis not present

## 2015-04-03 DIAGNOSIS — J3081 Allergic rhinitis due to animal (cat) (dog) hair and dander: Secondary | ICD-10-CM | POA: Diagnosis not present

## 2015-04-09 DIAGNOSIS — J3081 Allergic rhinitis due to animal (cat) (dog) hair and dander: Secondary | ICD-10-CM | POA: Diagnosis not present

## 2015-04-09 DIAGNOSIS — J301 Allergic rhinitis due to pollen: Secondary | ICD-10-CM | POA: Diagnosis not present

## 2015-04-09 DIAGNOSIS — J3089 Other allergic rhinitis: Secondary | ICD-10-CM | POA: Diagnosis not present

## 2015-04-25 DIAGNOSIS — J3089 Other allergic rhinitis: Secondary | ICD-10-CM | POA: Diagnosis not present

## 2015-04-25 DIAGNOSIS — J301 Allergic rhinitis due to pollen: Secondary | ICD-10-CM | POA: Diagnosis not present

## 2015-04-25 DIAGNOSIS — J3081 Allergic rhinitis due to animal (cat) (dog) hair and dander: Secondary | ICD-10-CM | POA: Diagnosis not present

## 2015-05-02 DIAGNOSIS — J3081 Allergic rhinitis due to animal (cat) (dog) hair and dander: Secondary | ICD-10-CM | POA: Diagnosis not present

## 2015-05-02 DIAGNOSIS — J301 Allergic rhinitis due to pollen: Secondary | ICD-10-CM | POA: Diagnosis not present

## 2015-05-02 DIAGNOSIS — J3089 Other allergic rhinitis: Secondary | ICD-10-CM | POA: Diagnosis not present

## 2015-05-09 DIAGNOSIS — J3081 Allergic rhinitis due to animal (cat) (dog) hair and dander: Secondary | ICD-10-CM | POA: Diagnosis not present

## 2015-05-09 DIAGNOSIS — J3089 Other allergic rhinitis: Secondary | ICD-10-CM | POA: Diagnosis not present

## 2015-05-09 DIAGNOSIS — J301 Allergic rhinitis due to pollen: Secondary | ICD-10-CM | POA: Diagnosis not present

## 2015-05-16 DIAGNOSIS — J3081 Allergic rhinitis due to animal (cat) (dog) hair and dander: Secondary | ICD-10-CM | POA: Diagnosis not present

## 2015-05-16 DIAGNOSIS — J3089 Other allergic rhinitis: Secondary | ICD-10-CM | POA: Diagnosis not present

## 2015-05-16 DIAGNOSIS — J301 Allergic rhinitis due to pollen: Secondary | ICD-10-CM | POA: Diagnosis not present

## 2015-05-23 DIAGNOSIS — J301 Allergic rhinitis due to pollen: Secondary | ICD-10-CM | POA: Diagnosis not present

## 2015-05-23 DIAGNOSIS — J3089 Other allergic rhinitis: Secondary | ICD-10-CM | POA: Diagnosis not present

## 2015-05-23 DIAGNOSIS — J3081 Allergic rhinitis due to animal (cat) (dog) hair and dander: Secondary | ICD-10-CM | POA: Diagnosis not present

## 2015-05-29 DIAGNOSIS — J301 Allergic rhinitis due to pollen: Secondary | ICD-10-CM | POA: Diagnosis not present

## 2015-05-29 DIAGNOSIS — J3081 Allergic rhinitis due to animal (cat) (dog) hair and dander: Secondary | ICD-10-CM | POA: Diagnosis not present

## 2015-05-29 DIAGNOSIS — J3089 Other allergic rhinitis: Secondary | ICD-10-CM | POA: Diagnosis not present

## 2015-06-05 DIAGNOSIS — J3089 Other allergic rhinitis: Secondary | ICD-10-CM | POA: Diagnosis not present

## 2015-06-05 DIAGNOSIS — J3081 Allergic rhinitis due to animal (cat) (dog) hair and dander: Secondary | ICD-10-CM | POA: Diagnosis not present

## 2015-06-05 DIAGNOSIS — J301 Allergic rhinitis due to pollen: Secondary | ICD-10-CM | POA: Diagnosis not present

## 2015-06-11 DIAGNOSIS — J3081 Allergic rhinitis due to animal (cat) (dog) hair and dander: Secondary | ICD-10-CM | POA: Diagnosis not present

## 2015-06-11 DIAGNOSIS — J301 Allergic rhinitis due to pollen: Secondary | ICD-10-CM | POA: Diagnosis not present

## 2015-06-11 DIAGNOSIS — J3089 Other allergic rhinitis: Secondary | ICD-10-CM | POA: Diagnosis not present

## 2015-06-13 DIAGNOSIS — J3089 Other allergic rhinitis: Secondary | ICD-10-CM | POA: Diagnosis not present

## 2015-06-13 DIAGNOSIS — J3081 Allergic rhinitis due to animal (cat) (dog) hair and dander: Secondary | ICD-10-CM | POA: Diagnosis not present

## 2015-06-13 DIAGNOSIS — J301 Allergic rhinitis due to pollen: Secondary | ICD-10-CM | POA: Diagnosis not present

## 2015-06-18 DIAGNOSIS — J3081 Allergic rhinitis due to animal (cat) (dog) hair and dander: Secondary | ICD-10-CM | POA: Diagnosis not present

## 2015-06-18 DIAGNOSIS — J301 Allergic rhinitis due to pollen: Secondary | ICD-10-CM | POA: Diagnosis not present

## 2015-06-18 DIAGNOSIS — J3089 Other allergic rhinitis: Secondary | ICD-10-CM | POA: Diagnosis not present

## 2015-06-25 DIAGNOSIS — J301 Allergic rhinitis due to pollen: Secondary | ICD-10-CM | POA: Diagnosis not present

## 2015-06-25 DIAGNOSIS — J3089 Other allergic rhinitis: Secondary | ICD-10-CM | POA: Diagnosis not present

## 2015-06-25 DIAGNOSIS — J3081 Allergic rhinitis due to animal (cat) (dog) hair and dander: Secondary | ICD-10-CM | POA: Diagnosis not present

## 2015-07-04 DIAGNOSIS — J3081 Allergic rhinitis due to animal (cat) (dog) hair and dander: Secondary | ICD-10-CM | POA: Diagnosis not present

## 2015-07-04 DIAGNOSIS — J301 Allergic rhinitis due to pollen: Secondary | ICD-10-CM | POA: Diagnosis not present

## 2015-07-04 DIAGNOSIS — T781XXA Other adverse food reactions, not elsewhere classified, initial encounter: Secondary | ICD-10-CM | POA: Diagnosis not present

## 2015-07-04 DIAGNOSIS — R062 Wheezing: Secondary | ICD-10-CM | POA: Diagnosis not present

## 2015-07-04 DIAGNOSIS — J3089 Other allergic rhinitis: Secondary | ICD-10-CM | POA: Diagnosis not present

## 2015-07-09 DIAGNOSIS — J3081 Allergic rhinitis due to animal (cat) (dog) hair and dander: Secondary | ICD-10-CM | POA: Diagnosis not present

## 2015-07-09 DIAGNOSIS — J301 Allergic rhinitis due to pollen: Secondary | ICD-10-CM | POA: Diagnosis not present

## 2015-07-09 DIAGNOSIS — J3089 Other allergic rhinitis: Secondary | ICD-10-CM | POA: Diagnosis not present

## 2015-07-18 DIAGNOSIS — J301 Allergic rhinitis due to pollen: Secondary | ICD-10-CM | POA: Diagnosis not present

## 2015-07-18 DIAGNOSIS — J3081 Allergic rhinitis due to animal (cat) (dog) hair and dander: Secondary | ICD-10-CM | POA: Diagnosis not present

## 2015-07-18 DIAGNOSIS — J3089 Other allergic rhinitis: Secondary | ICD-10-CM | POA: Diagnosis not present

## 2015-07-24 DIAGNOSIS — J3081 Allergic rhinitis due to animal (cat) (dog) hair and dander: Secondary | ICD-10-CM | POA: Diagnosis not present

## 2015-07-24 DIAGNOSIS — J3089 Other allergic rhinitis: Secondary | ICD-10-CM | POA: Diagnosis not present

## 2015-07-24 DIAGNOSIS — J301 Allergic rhinitis due to pollen: Secondary | ICD-10-CM | POA: Diagnosis not present

## 2015-08-01 DIAGNOSIS — J301 Allergic rhinitis due to pollen: Secondary | ICD-10-CM | POA: Diagnosis not present

## 2015-08-01 DIAGNOSIS — J3089 Other allergic rhinitis: Secondary | ICD-10-CM | POA: Diagnosis not present

## 2015-08-01 DIAGNOSIS — J3081 Allergic rhinitis due to animal (cat) (dog) hair and dander: Secondary | ICD-10-CM | POA: Diagnosis not present

## 2015-08-05 DIAGNOSIS — J3089 Other allergic rhinitis: Secondary | ICD-10-CM | POA: Diagnosis not present

## 2015-08-05 DIAGNOSIS — J3081 Allergic rhinitis due to animal (cat) (dog) hair and dander: Secondary | ICD-10-CM | POA: Diagnosis not present

## 2015-08-05 DIAGNOSIS — J301 Allergic rhinitis due to pollen: Secondary | ICD-10-CM | POA: Diagnosis not present

## 2015-08-09 DIAGNOSIS — J3089 Other allergic rhinitis: Secondary | ICD-10-CM | POA: Diagnosis not present

## 2015-08-09 DIAGNOSIS — J301 Allergic rhinitis due to pollen: Secondary | ICD-10-CM | POA: Diagnosis not present

## 2015-08-12 DIAGNOSIS — Z8639 Personal history of other endocrine, nutritional and metabolic disease: Secondary | ICD-10-CM | POA: Diagnosis not present

## 2015-08-12 DIAGNOSIS — R42 Dizziness and giddiness: Secondary | ICD-10-CM | POA: Diagnosis not present

## 2015-08-15 DIAGNOSIS — J3089 Other allergic rhinitis: Secondary | ICD-10-CM | POA: Diagnosis not present

## 2015-08-15 DIAGNOSIS — J301 Allergic rhinitis due to pollen: Secondary | ICD-10-CM | POA: Diagnosis not present

## 2015-08-15 DIAGNOSIS — J3081 Allergic rhinitis due to animal (cat) (dog) hair and dander: Secondary | ICD-10-CM | POA: Diagnosis not present

## 2015-08-21 DIAGNOSIS — J3081 Allergic rhinitis due to animal (cat) (dog) hair and dander: Secondary | ICD-10-CM | POA: Diagnosis not present

## 2015-08-21 DIAGNOSIS — J301 Allergic rhinitis due to pollen: Secondary | ICD-10-CM | POA: Diagnosis not present

## 2015-08-21 DIAGNOSIS — J3089 Other allergic rhinitis: Secondary | ICD-10-CM | POA: Diagnosis not present

## 2015-08-29 DIAGNOSIS — J3081 Allergic rhinitis due to animal (cat) (dog) hair and dander: Secondary | ICD-10-CM | POA: Diagnosis not present

## 2015-08-29 DIAGNOSIS — J3089 Other allergic rhinitis: Secondary | ICD-10-CM | POA: Diagnosis not present

## 2015-08-29 DIAGNOSIS — J301 Allergic rhinitis due to pollen: Secondary | ICD-10-CM | POA: Diagnosis not present

## 2015-09-03 DIAGNOSIS — J3081 Allergic rhinitis due to animal (cat) (dog) hair and dander: Secondary | ICD-10-CM | POA: Diagnosis not present

## 2015-09-03 DIAGNOSIS — J301 Allergic rhinitis due to pollen: Secondary | ICD-10-CM | POA: Diagnosis not present

## 2015-09-03 DIAGNOSIS — J3089 Other allergic rhinitis: Secondary | ICD-10-CM | POA: Diagnosis not present

## 2015-09-09 DIAGNOSIS — J3089 Other allergic rhinitis: Secondary | ICD-10-CM | POA: Diagnosis not present

## 2015-09-09 DIAGNOSIS — J301 Allergic rhinitis due to pollen: Secondary | ICD-10-CM | POA: Diagnosis not present

## 2015-09-09 DIAGNOSIS — J3081 Allergic rhinitis due to animal (cat) (dog) hair and dander: Secondary | ICD-10-CM | POA: Diagnosis not present

## 2015-09-17 DIAGNOSIS — J3089 Other allergic rhinitis: Secondary | ICD-10-CM | POA: Diagnosis not present

## 2015-09-17 DIAGNOSIS — J301 Allergic rhinitis due to pollen: Secondary | ICD-10-CM | POA: Diagnosis not present

## 2015-09-17 DIAGNOSIS — J3081 Allergic rhinitis due to animal (cat) (dog) hair and dander: Secondary | ICD-10-CM | POA: Diagnosis not present

## 2015-09-25 DIAGNOSIS — J3089 Other allergic rhinitis: Secondary | ICD-10-CM | POA: Diagnosis not present

## 2015-09-25 DIAGNOSIS — J301 Allergic rhinitis due to pollen: Secondary | ICD-10-CM | POA: Diagnosis not present

## 2015-09-25 DIAGNOSIS — J3081 Allergic rhinitis due to animal (cat) (dog) hair and dander: Secondary | ICD-10-CM | POA: Diagnosis not present

## 2015-10-01 DIAGNOSIS — J301 Allergic rhinitis due to pollen: Secondary | ICD-10-CM | POA: Diagnosis not present

## 2015-10-01 DIAGNOSIS — J3081 Allergic rhinitis due to animal (cat) (dog) hair and dander: Secondary | ICD-10-CM | POA: Diagnosis not present

## 2015-10-01 DIAGNOSIS — J3089 Other allergic rhinitis: Secondary | ICD-10-CM | POA: Diagnosis not present

## 2015-10-07 DIAGNOSIS — J3089 Other allergic rhinitis: Secondary | ICD-10-CM | POA: Diagnosis not present

## 2015-10-07 DIAGNOSIS — J301 Allergic rhinitis due to pollen: Secondary | ICD-10-CM | POA: Diagnosis not present

## 2015-10-07 DIAGNOSIS — J3081 Allergic rhinitis due to animal (cat) (dog) hair and dander: Secondary | ICD-10-CM | POA: Diagnosis not present

## 2015-10-15 DIAGNOSIS — J3081 Allergic rhinitis due to animal (cat) (dog) hair and dander: Secondary | ICD-10-CM | POA: Diagnosis not present

## 2015-10-15 DIAGNOSIS — J3089 Other allergic rhinitis: Secondary | ICD-10-CM | POA: Diagnosis not present

## 2015-10-15 DIAGNOSIS — J301 Allergic rhinitis due to pollen: Secondary | ICD-10-CM | POA: Diagnosis not present

## 2015-10-24 DIAGNOSIS — J3089 Other allergic rhinitis: Secondary | ICD-10-CM | POA: Diagnosis not present

## 2015-10-24 DIAGNOSIS — J301 Allergic rhinitis due to pollen: Secondary | ICD-10-CM | POA: Diagnosis not present

## 2015-10-24 DIAGNOSIS — J3081 Allergic rhinitis due to animal (cat) (dog) hair and dander: Secondary | ICD-10-CM | POA: Diagnosis not present

## 2015-10-29 DIAGNOSIS — J301 Allergic rhinitis due to pollen: Secondary | ICD-10-CM | POA: Diagnosis not present

## 2015-10-29 DIAGNOSIS — J3081 Allergic rhinitis due to animal (cat) (dog) hair and dander: Secondary | ICD-10-CM | POA: Diagnosis not present

## 2015-10-29 DIAGNOSIS — J3089 Other allergic rhinitis: Secondary | ICD-10-CM | POA: Diagnosis not present

## 2015-11-07 DIAGNOSIS — J3089 Other allergic rhinitis: Secondary | ICD-10-CM | POA: Diagnosis not present

## 2015-11-07 DIAGNOSIS — J301 Allergic rhinitis due to pollen: Secondary | ICD-10-CM | POA: Diagnosis not present

## 2015-11-07 DIAGNOSIS — J3081 Allergic rhinitis due to animal (cat) (dog) hair and dander: Secondary | ICD-10-CM | POA: Diagnosis not present

## 2015-11-12 DIAGNOSIS — J301 Allergic rhinitis due to pollen: Secondary | ICD-10-CM | POA: Diagnosis not present

## 2015-11-12 DIAGNOSIS — J3089 Other allergic rhinitis: Secondary | ICD-10-CM | POA: Diagnosis not present

## 2015-11-12 DIAGNOSIS — J3081 Allergic rhinitis due to animal (cat) (dog) hair and dander: Secondary | ICD-10-CM | POA: Diagnosis not present

## 2015-11-15 DIAGNOSIS — J3089 Other allergic rhinitis: Secondary | ICD-10-CM | POA: Diagnosis not present

## 2015-11-15 DIAGNOSIS — J301 Allergic rhinitis due to pollen: Secondary | ICD-10-CM | POA: Diagnosis not present

## 2015-11-15 DIAGNOSIS — J3081 Allergic rhinitis due to animal (cat) (dog) hair and dander: Secondary | ICD-10-CM | POA: Diagnosis not present

## 2015-11-21 DIAGNOSIS — J301 Allergic rhinitis due to pollen: Secondary | ICD-10-CM | POA: Diagnosis not present

## 2015-11-21 DIAGNOSIS — J3089 Other allergic rhinitis: Secondary | ICD-10-CM | POA: Diagnosis not present

## 2015-11-21 DIAGNOSIS — J3081 Allergic rhinitis due to animal (cat) (dog) hair and dander: Secondary | ICD-10-CM | POA: Diagnosis not present

## 2015-11-28 DIAGNOSIS — J301 Allergic rhinitis due to pollen: Secondary | ICD-10-CM | POA: Diagnosis not present

## 2015-11-28 DIAGNOSIS — J3089 Other allergic rhinitis: Secondary | ICD-10-CM | POA: Diagnosis not present

## 2015-11-28 DIAGNOSIS — J3081 Allergic rhinitis due to animal (cat) (dog) hair and dander: Secondary | ICD-10-CM | POA: Diagnosis not present

## 2015-12-05 DIAGNOSIS — J3081 Allergic rhinitis due to animal (cat) (dog) hair and dander: Secondary | ICD-10-CM | POA: Diagnosis not present

## 2015-12-05 DIAGNOSIS — J3089 Other allergic rhinitis: Secondary | ICD-10-CM | POA: Diagnosis not present

## 2015-12-05 DIAGNOSIS — J301 Allergic rhinitis due to pollen: Secondary | ICD-10-CM | POA: Diagnosis not present

## 2015-12-12 DIAGNOSIS — J301 Allergic rhinitis due to pollen: Secondary | ICD-10-CM | POA: Diagnosis not present

## 2015-12-12 DIAGNOSIS — J3081 Allergic rhinitis due to animal (cat) (dog) hair and dander: Secondary | ICD-10-CM | POA: Diagnosis not present

## 2015-12-12 DIAGNOSIS — J3089 Other allergic rhinitis: Secondary | ICD-10-CM | POA: Diagnosis not present

## 2015-12-19 DIAGNOSIS — J3089 Other allergic rhinitis: Secondary | ICD-10-CM | POA: Diagnosis not present

## 2015-12-19 DIAGNOSIS — J3081 Allergic rhinitis due to animal (cat) (dog) hair and dander: Secondary | ICD-10-CM | POA: Diagnosis not present

## 2015-12-19 DIAGNOSIS — J301 Allergic rhinitis due to pollen: Secondary | ICD-10-CM | POA: Diagnosis not present

## 2015-12-23 DIAGNOSIS — J301 Allergic rhinitis due to pollen: Secondary | ICD-10-CM | POA: Diagnosis not present

## 2015-12-23 DIAGNOSIS — J3089 Other allergic rhinitis: Secondary | ICD-10-CM | POA: Diagnosis not present

## 2015-12-23 DIAGNOSIS — J3081 Allergic rhinitis due to animal (cat) (dog) hair and dander: Secondary | ICD-10-CM | POA: Diagnosis not present

## 2015-12-26 DIAGNOSIS — J3089 Other allergic rhinitis: Secondary | ICD-10-CM | POA: Diagnosis not present

## 2015-12-26 DIAGNOSIS — J301 Allergic rhinitis due to pollen: Secondary | ICD-10-CM | POA: Diagnosis not present

## 2015-12-26 DIAGNOSIS — J3081 Allergic rhinitis due to animal (cat) (dog) hair and dander: Secondary | ICD-10-CM | POA: Diagnosis not present

## 2016-01-07 DIAGNOSIS — J3089 Other allergic rhinitis: Secondary | ICD-10-CM | POA: Diagnosis not present

## 2016-01-07 DIAGNOSIS — J3081 Allergic rhinitis due to animal (cat) (dog) hair and dander: Secondary | ICD-10-CM | POA: Diagnosis not present

## 2016-01-07 DIAGNOSIS — J301 Allergic rhinitis due to pollen: Secondary | ICD-10-CM | POA: Diagnosis not present

## 2016-01-09 DIAGNOSIS — J3089 Other allergic rhinitis: Secondary | ICD-10-CM | POA: Diagnosis not present

## 2016-01-09 DIAGNOSIS — J301 Allergic rhinitis due to pollen: Secondary | ICD-10-CM | POA: Diagnosis not present

## 2016-01-09 DIAGNOSIS — J3081 Allergic rhinitis due to animal (cat) (dog) hair and dander: Secondary | ICD-10-CM | POA: Diagnosis not present

## 2016-01-15 DIAGNOSIS — J3081 Allergic rhinitis due to animal (cat) (dog) hair and dander: Secondary | ICD-10-CM | POA: Diagnosis not present

## 2016-01-15 DIAGNOSIS — J3089 Other allergic rhinitis: Secondary | ICD-10-CM | POA: Diagnosis not present

## 2016-01-15 DIAGNOSIS — J301 Allergic rhinitis due to pollen: Secondary | ICD-10-CM | POA: Diagnosis not present

## 2016-01-21 DIAGNOSIS — K219 Gastro-esophageal reflux disease without esophagitis: Secondary | ICD-10-CM | POA: Diagnosis not present

## 2016-01-21 DIAGNOSIS — J3081 Allergic rhinitis due to animal (cat) (dog) hair and dander: Secondary | ICD-10-CM | POA: Diagnosis not present

## 2016-01-21 DIAGNOSIS — Z1389 Encounter for screening for other disorder: Secondary | ICD-10-CM | POA: Diagnosis not present

## 2016-01-21 DIAGNOSIS — Z0000 Encounter for general adult medical examination without abnormal findings: Secondary | ICD-10-CM | POA: Diagnosis not present

## 2016-01-21 DIAGNOSIS — J301 Allergic rhinitis due to pollen: Secondary | ICD-10-CM | POA: Diagnosis not present

## 2016-01-21 DIAGNOSIS — J3089 Other allergic rhinitis: Secondary | ICD-10-CM | POA: Diagnosis not present

## 2016-01-22 DIAGNOSIS — J3089 Other allergic rhinitis: Secondary | ICD-10-CM | POA: Diagnosis not present

## 2016-01-22 DIAGNOSIS — J301 Allergic rhinitis due to pollen: Secondary | ICD-10-CM | POA: Diagnosis not present

## 2016-01-22 DIAGNOSIS — R062 Wheezing: Secondary | ICD-10-CM | POA: Diagnosis not present

## 2016-01-22 DIAGNOSIS — T781XXA Other adverse food reactions, not elsewhere classified, initial encounter: Secondary | ICD-10-CM | POA: Diagnosis not present

## 2016-01-27 DIAGNOSIS — J3089 Other allergic rhinitis: Secondary | ICD-10-CM | POA: Diagnosis not present

## 2016-01-27 DIAGNOSIS — J3081 Allergic rhinitis due to animal (cat) (dog) hair and dander: Secondary | ICD-10-CM | POA: Diagnosis not present

## 2016-01-27 DIAGNOSIS — J301 Allergic rhinitis due to pollen: Secondary | ICD-10-CM | POA: Diagnosis not present

## 2016-02-05 DIAGNOSIS — J3089 Other allergic rhinitis: Secondary | ICD-10-CM | POA: Diagnosis not present

## 2016-02-05 DIAGNOSIS — J301 Allergic rhinitis due to pollen: Secondary | ICD-10-CM | POA: Diagnosis not present

## 2016-02-05 DIAGNOSIS — J3081 Allergic rhinitis due to animal (cat) (dog) hair and dander: Secondary | ICD-10-CM | POA: Diagnosis not present

## 2016-02-20 DIAGNOSIS — J3081 Allergic rhinitis due to animal (cat) (dog) hair and dander: Secondary | ICD-10-CM | POA: Diagnosis not present

## 2016-02-20 DIAGNOSIS — J3089 Other allergic rhinitis: Secondary | ICD-10-CM | POA: Diagnosis not present

## 2016-02-20 DIAGNOSIS — J301 Allergic rhinitis due to pollen: Secondary | ICD-10-CM | POA: Diagnosis not present

## 2016-02-25 DIAGNOSIS — J3089 Other allergic rhinitis: Secondary | ICD-10-CM | POA: Diagnosis not present

## 2016-02-25 DIAGNOSIS — J301 Allergic rhinitis due to pollen: Secondary | ICD-10-CM | POA: Diagnosis not present

## 2016-02-25 DIAGNOSIS — J3081 Allergic rhinitis due to animal (cat) (dog) hair and dander: Secondary | ICD-10-CM | POA: Diagnosis not present

## 2016-03-03 DIAGNOSIS — J3081 Allergic rhinitis due to animal (cat) (dog) hair and dander: Secondary | ICD-10-CM | POA: Diagnosis not present

## 2016-03-03 DIAGNOSIS — J301 Allergic rhinitis due to pollen: Secondary | ICD-10-CM | POA: Diagnosis not present

## 2016-03-03 DIAGNOSIS — J3089 Other allergic rhinitis: Secondary | ICD-10-CM | POA: Diagnosis not present

## 2016-03-12 DIAGNOSIS — J3081 Allergic rhinitis due to animal (cat) (dog) hair and dander: Secondary | ICD-10-CM | POA: Diagnosis not present

## 2016-03-12 DIAGNOSIS — J3089 Other allergic rhinitis: Secondary | ICD-10-CM | POA: Diagnosis not present

## 2016-03-12 DIAGNOSIS — J301 Allergic rhinitis due to pollen: Secondary | ICD-10-CM | POA: Diagnosis not present

## 2016-03-19 DIAGNOSIS — J301 Allergic rhinitis due to pollen: Secondary | ICD-10-CM | POA: Diagnosis not present

## 2016-03-19 DIAGNOSIS — J3081 Allergic rhinitis due to animal (cat) (dog) hair and dander: Secondary | ICD-10-CM | POA: Diagnosis not present

## 2016-03-19 DIAGNOSIS — J3089 Other allergic rhinitis: Secondary | ICD-10-CM | POA: Diagnosis not present

## 2016-03-27 DIAGNOSIS — J301 Allergic rhinitis due to pollen: Secondary | ICD-10-CM | POA: Diagnosis not present

## 2016-03-27 DIAGNOSIS — J3089 Other allergic rhinitis: Secondary | ICD-10-CM | POA: Diagnosis not present

## 2016-03-27 DIAGNOSIS — J3081 Allergic rhinitis due to animal (cat) (dog) hair and dander: Secondary | ICD-10-CM | POA: Diagnosis not present

## 2016-04-02 DIAGNOSIS — J301 Allergic rhinitis due to pollen: Secondary | ICD-10-CM | POA: Diagnosis not present

## 2016-04-02 DIAGNOSIS — J3081 Allergic rhinitis due to animal (cat) (dog) hair and dander: Secondary | ICD-10-CM | POA: Diagnosis not present

## 2016-04-02 DIAGNOSIS — J3089 Other allergic rhinitis: Secondary | ICD-10-CM | POA: Diagnosis not present

## 2016-04-07 DIAGNOSIS — J3089 Other allergic rhinitis: Secondary | ICD-10-CM | POA: Diagnosis not present

## 2016-04-07 DIAGNOSIS — Z01818 Encounter for other preprocedural examination: Secondary | ICD-10-CM | POA: Diagnosis not present

## 2016-04-07 DIAGNOSIS — J301 Allergic rhinitis due to pollen: Secondary | ICD-10-CM | POA: Diagnosis not present

## 2016-04-07 DIAGNOSIS — J3081 Allergic rhinitis due to animal (cat) (dog) hair and dander: Secondary | ICD-10-CM | POA: Diagnosis not present

## 2016-04-23 DIAGNOSIS — J301 Allergic rhinitis due to pollen: Secondary | ICD-10-CM | POA: Diagnosis not present

## 2016-04-23 DIAGNOSIS — J3089 Other allergic rhinitis: Secondary | ICD-10-CM | POA: Diagnosis not present

## 2016-04-23 DIAGNOSIS — J3081 Allergic rhinitis due to animal (cat) (dog) hair and dander: Secondary | ICD-10-CM | POA: Diagnosis not present

## 2016-04-30 DIAGNOSIS — J301 Allergic rhinitis due to pollen: Secondary | ICD-10-CM | POA: Diagnosis not present

## 2016-04-30 DIAGNOSIS — J3089 Other allergic rhinitis: Secondary | ICD-10-CM | POA: Diagnosis not present

## 2016-04-30 DIAGNOSIS — J3081 Allergic rhinitis due to animal (cat) (dog) hair and dander: Secondary | ICD-10-CM | POA: Diagnosis not present

## 2016-05-04 DIAGNOSIS — J301 Allergic rhinitis due to pollen: Secondary | ICD-10-CM | POA: Diagnosis not present

## 2016-05-04 DIAGNOSIS — J3089 Other allergic rhinitis: Secondary | ICD-10-CM | POA: Diagnosis not present

## 2016-05-04 DIAGNOSIS — J3081 Allergic rhinitis due to animal (cat) (dog) hair and dander: Secondary | ICD-10-CM | POA: Diagnosis not present

## 2016-05-14 DIAGNOSIS — J301 Allergic rhinitis due to pollen: Secondary | ICD-10-CM | POA: Diagnosis not present

## 2016-05-14 DIAGNOSIS — J3089 Other allergic rhinitis: Secondary | ICD-10-CM | POA: Diagnosis not present

## 2016-05-14 DIAGNOSIS — J3081 Allergic rhinitis due to animal (cat) (dog) hair and dander: Secondary | ICD-10-CM | POA: Diagnosis not present

## 2016-05-19 DIAGNOSIS — J3081 Allergic rhinitis due to animal (cat) (dog) hair and dander: Secondary | ICD-10-CM | POA: Diagnosis not present

## 2016-05-19 DIAGNOSIS — J3089 Other allergic rhinitis: Secondary | ICD-10-CM | POA: Diagnosis not present

## 2016-05-19 DIAGNOSIS — J301 Allergic rhinitis due to pollen: Secondary | ICD-10-CM | POA: Diagnosis not present

## 2016-05-21 DIAGNOSIS — J3089 Other allergic rhinitis: Secondary | ICD-10-CM | POA: Diagnosis not present

## 2016-05-21 DIAGNOSIS — J3081 Allergic rhinitis due to animal (cat) (dog) hair and dander: Secondary | ICD-10-CM | POA: Diagnosis not present

## 2016-05-21 DIAGNOSIS — J301 Allergic rhinitis due to pollen: Secondary | ICD-10-CM | POA: Diagnosis not present

## 2016-05-27 DIAGNOSIS — J3089 Other allergic rhinitis: Secondary | ICD-10-CM | POA: Diagnosis not present

## 2016-05-27 DIAGNOSIS — J301 Allergic rhinitis due to pollen: Secondary | ICD-10-CM | POA: Diagnosis not present

## 2016-06-03 DIAGNOSIS — J3089 Other allergic rhinitis: Secondary | ICD-10-CM | POA: Diagnosis not present

## 2016-06-03 DIAGNOSIS — J3081 Allergic rhinitis due to animal (cat) (dog) hair and dander: Secondary | ICD-10-CM | POA: Diagnosis not present

## 2016-06-03 DIAGNOSIS — J301 Allergic rhinitis due to pollen: Secondary | ICD-10-CM | POA: Diagnosis not present

## 2016-06-05 DIAGNOSIS — J3089 Other allergic rhinitis: Secondary | ICD-10-CM | POA: Diagnosis not present

## 2016-06-05 DIAGNOSIS — J301 Allergic rhinitis due to pollen: Secondary | ICD-10-CM | POA: Diagnosis not present

## 2016-06-05 DIAGNOSIS — J3081 Allergic rhinitis due to animal (cat) (dog) hair and dander: Secondary | ICD-10-CM | POA: Diagnosis not present

## 2016-06-09 DIAGNOSIS — J301 Allergic rhinitis due to pollen: Secondary | ICD-10-CM | POA: Diagnosis not present

## 2016-06-09 DIAGNOSIS — J3081 Allergic rhinitis due to animal (cat) (dog) hair and dander: Secondary | ICD-10-CM | POA: Diagnosis not present

## 2016-06-09 DIAGNOSIS — J3089 Other allergic rhinitis: Secondary | ICD-10-CM | POA: Diagnosis not present

## 2016-06-11 DIAGNOSIS — J3089 Other allergic rhinitis: Secondary | ICD-10-CM | POA: Diagnosis not present

## 2016-06-11 DIAGNOSIS — J301 Allergic rhinitis due to pollen: Secondary | ICD-10-CM | POA: Diagnosis not present

## 2016-06-11 DIAGNOSIS — J3081 Allergic rhinitis due to animal (cat) (dog) hair and dander: Secondary | ICD-10-CM | POA: Diagnosis not present

## 2016-06-16 DIAGNOSIS — J3089 Other allergic rhinitis: Secondary | ICD-10-CM | POA: Diagnosis not present

## 2016-06-16 DIAGNOSIS — J3081 Allergic rhinitis due to animal (cat) (dog) hair and dander: Secondary | ICD-10-CM | POA: Diagnosis not present

## 2016-06-16 DIAGNOSIS — J301 Allergic rhinitis due to pollen: Secondary | ICD-10-CM | POA: Diagnosis not present

## 2016-06-23 DIAGNOSIS — J301 Allergic rhinitis due to pollen: Secondary | ICD-10-CM | POA: Diagnosis not present

## 2016-06-23 DIAGNOSIS — J3081 Allergic rhinitis due to animal (cat) (dog) hair and dander: Secondary | ICD-10-CM | POA: Diagnosis not present

## 2016-06-23 DIAGNOSIS — J3089 Other allergic rhinitis: Secondary | ICD-10-CM | POA: Diagnosis not present

## 2016-06-30 DIAGNOSIS — J3081 Allergic rhinitis due to animal (cat) (dog) hair and dander: Secondary | ICD-10-CM | POA: Diagnosis not present

## 2016-06-30 DIAGNOSIS — J3089 Other allergic rhinitis: Secondary | ICD-10-CM | POA: Diagnosis not present

## 2016-06-30 DIAGNOSIS — J301 Allergic rhinitis due to pollen: Secondary | ICD-10-CM | POA: Diagnosis not present

## 2016-07-06 DIAGNOSIS — J3089 Other allergic rhinitis: Secondary | ICD-10-CM | POA: Diagnosis not present

## 2016-07-06 DIAGNOSIS — J3081 Allergic rhinitis due to animal (cat) (dog) hair and dander: Secondary | ICD-10-CM | POA: Diagnosis not present

## 2016-07-06 DIAGNOSIS — J301 Allergic rhinitis due to pollen: Secondary | ICD-10-CM | POA: Diagnosis not present

## 2016-07-15 DIAGNOSIS — J3081 Allergic rhinitis due to animal (cat) (dog) hair and dander: Secondary | ICD-10-CM | POA: Diagnosis not present

## 2016-07-15 DIAGNOSIS — J3089 Other allergic rhinitis: Secondary | ICD-10-CM | POA: Diagnosis not present

## 2016-07-15 DIAGNOSIS — J301 Allergic rhinitis due to pollen: Secondary | ICD-10-CM | POA: Diagnosis not present

## 2016-07-21 DIAGNOSIS — D225 Melanocytic nevi of trunk: Secondary | ICD-10-CM | POA: Diagnosis not present

## 2016-07-21 DIAGNOSIS — L821 Other seborrheic keratosis: Secondary | ICD-10-CM | POA: Diagnosis not present

## 2016-07-21 DIAGNOSIS — L309 Dermatitis, unspecified: Secondary | ICD-10-CM | POA: Diagnosis not present

## 2016-07-21 DIAGNOSIS — L820 Inflamed seborrheic keratosis: Secondary | ICD-10-CM | POA: Diagnosis not present

## 2016-07-22 DIAGNOSIS — J3081 Allergic rhinitis due to animal (cat) (dog) hair and dander: Secondary | ICD-10-CM | POA: Diagnosis not present

## 2016-07-22 DIAGNOSIS — J3089 Other allergic rhinitis: Secondary | ICD-10-CM | POA: Diagnosis not present

## 2016-07-22 DIAGNOSIS — J301 Allergic rhinitis due to pollen: Secondary | ICD-10-CM | POA: Diagnosis not present

## 2016-07-27 DIAGNOSIS — J3081 Allergic rhinitis due to animal (cat) (dog) hair and dander: Secondary | ICD-10-CM | POA: Diagnosis not present

## 2016-07-27 DIAGNOSIS — J3089 Other allergic rhinitis: Secondary | ICD-10-CM | POA: Diagnosis not present

## 2016-07-27 DIAGNOSIS — J301 Allergic rhinitis due to pollen: Secondary | ICD-10-CM | POA: Diagnosis not present

## 2016-08-06 DIAGNOSIS — J301 Allergic rhinitis due to pollen: Secondary | ICD-10-CM | POA: Diagnosis not present

## 2016-08-06 DIAGNOSIS — J3089 Other allergic rhinitis: Secondary | ICD-10-CM | POA: Diagnosis not present

## 2016-08-10 DIAGNOSIS — J3081 Allergic rhinitis due to animal (cat) (dog) hair and dander: Secondary | ICD-10-CM | POA: Diagnosis not present

## 2016-08-10 DIAGNOSIS — J301 Allergic rhinitis due to pollen: Secondary | ICD-10-CM | POA: Diagnosis not present

## 2016-08-10 DIAGNOSIS — J3089 Other allergic rhinitis: Secondary | ICD-10-CM | POA: Diagnosis not present

## 2016-08-18 DIAGNOSIS — J3089 Other allergic rhinitis: Secondary | ICD-10-CM | POA: Diagnosis not present

## 2016-08-18 DIAGNOSIS — J3081 Allergic rhinitis due to animal (cat) (dog) hair and dander: Secondary | ICD-10-CM | POA: Diagnosis not present

## 2016-08-18 DIAGNOSIS — J301 Allergic rhinitis due to pollen: Secondary | ICD-10-CM | POA: Diagnosis not present

## 2016-08-19 DIAGNOSIS — J301 Allergic rhinitis due to pollen: Secondary | ICD-10-CM | POA: Diagnosis not present

## 2016-08-19 DIAGNOSIS — R062 Wheezing: Secondary | ICD-10-CM | POA: Diagnosis not present

## 2016-08-19 DIAGNOSIS — J3089 Other allergic rhinitis: Secondary | ICD-10-CM | POA: Diagnosis not present

## 2016-08-19 DIAGNOSIS — T781XXA Other adverse food reactions, not elsewhere classified, initial encounter: Secondary | ICD-10-CM | POA: Diagnosis not present

## 2016-08-27 DIAGNOSIS — H26492 Other secondary cataract, left eye: Secondary | ICD-10-CM | POA: Diagnosis not present

## 2016-08-27 DIAGNOSIS — Z961 Presence of intraocular lens: Secondary | ICD-10-CM | POA: Diagnosis not present

## 2016-09-01 DIAGNOSIS — J3089 Other allergic rhinitis: Secondary | ICD-10-CM | POA: Diagnosis not present

## 2016-09-01 DIAGNOSIS — J301 Allergic rhinitis due to pollen: Secondary | ICD-10-CM | POA: Diagnosis not present

## 2016-09-01 DIAGNOSIS — J3081 Allergic rhinitis due to animal (cat) (dog) hair and dander: Secondary | ICD-10-CM | POA: Diagnosis not present

## 2016-09-17 DIAGNOSIS — J3081 Allergic rhinitis due to animal (cat) (dog) hair and dander: Secondary | ICD-10-CM | POA: Diagnosis not present

## 2016-09-17 DIAGNOSIS — J301 Allergic rhinitis due to pollen: Secondary | ICD-10-CM | POA: Diagnosis not present

## 2016-09-17 DIAGNOSIS — J3089 Other allergic rhinitis: Secondary | ICD-10-CM | POA: Diagnosis not present

## 2016-09-30 DIAGNOSIS — J301 Allergic rhinitis due to pollen: Secondary | ICD-10-CM | POA: Diagnosis not present

## 2016-09-30 DIAGNOSIS — J3081 Allergic rhinitis due to animal (cat) (dog) hair and dander: Secondary | ICD-10-CM | POA: Diagnosis not present

## 2016-09-30 DIAGNOSIS — J3089 Other allergic rhinitis: Secondary | ICD-10-CM | POA: Diagnosis not present

## 2016-10-15 DIAGNOSIS — J3089 Other allergic rhinitis: Secondary | ICD-10-CM | POA: Diagnosis not present

## 2016-10-15 DIAGNOSIS — J301 Allergic rhinitis due to pollen: Secondary | ICD-10-CM | POA: Diagnosis not present

## 2016-10-15 DIAGNOSIS — J3081 Allergic rhinitis due to animal (cat) (dog) hair and dander: Secondary | ICD-10-CM | POA: Diagnosis not present

## 2016-10-27 DIAGNOSIS — J3081 Allergic rhinitis due to animal (cat) (dog) hair and dander: Secondary | ICD-10-CM | POA: Diagnosis not present

## 2016-10-27 DIAGNOSIS — J3089 Other allergic rhinitis: Secondary | ICD-10-CM | POA: Diagnosis not present

## 2016-10-27 DIAGNOSIS — J301 Allergic rhinitis due to pollen: Secondary | ICD-10-CM | POA: Diagnosis not present

## 2016-10-29 DIAGNOSIS — J301 Allergic rhinitis due to pollen: Secondary | ICD-10-CM | POA: Diagnosis not present

## 2016-10-29 DIAGNOSIS — J3089 Other allergic rhinitis: Secondary | ICD-10-CM | POA: Diagnosis not present

## 2016-10-29 DIAGNOSIS — J3081 Allergic rhinitis due to animal (cat) (dog) hair and dander: Secondary | ICD-10-CM | POA: Diagnosis not present

## 2016-11-11 DIAGNOSIS — J3089 Other allergic rhinitis: Secondary | ICD-10-CM | POA: Diagnosis not present

## 2016-11-11 DIAGNOSIS — J3081 Allergic rhinitis due to animal (cat) (dog) hair and dander: Secondary | ICD-10-CM | POA: Diagnosis not present

## 2016-11-11 DIAGNOSIS — J301 Allergic rhinitis due to pollen: Secondary | ICD-10-CM | POA: Diagnosis not present

## 2016-11-16 DIAGNOSIS — J310 Chronic rhinitis: Secondary | ICD-10-CM | POA: Diagnosis not present

## 2016-11-16 DIAGNOSIS — J301 Allergic rhinitis due to pollen: Secondary | ICD-10-CM | POA: Diagnosis not present

## 2016-11-16 DIAGNOSIS — J3089 Other allergic rhinitis: Secondary | ICD-10-CM | POA: Diagnosis not present

## 2016-11-16 DIAGNOSIS — R062 Wheezing: Secondary | ICD-10-CM | POA: Diagnosis not present

## 2016-11-16 DIAGNOSIS — J3081 Allergic rhinitis due to animal (cat) (dog) hair and dander: Secondary | ICD-10-CM | POA: Diagnosis not present

## 2016-11-16 DIAGNOSIS — T781XXA Other adverse food reactions, not elsewhere classified, initial encounter: Secondary | ICD-10-CM | POA: Diagnosis not present

## 2016-11-25 DIAGNOSIS — J3089 Other allergic rhinitis: Secondary | ICD-10-CM | POA: Diagnosis not present

## 2016-11-25 DIAGNOSIS — J3081 Allergic rhinitis due to animal (cat) (dog) hair and dander: Secondary | ICD-10-CM | POA: Diagnosis not present

## 2016-11-25 DIAGNOSIS — J301 Allergic rhinitis due to pollen: Secondary | ICD-10-CM | POA: Diagnosis not present

## 2016-12-02 DIAGNOSIS — J3089 Other allergic rhinitis: Secondary | ICD-10-CM | POA: Diagnosis not present

## 2016-12-02 DIAGNOSIS — J3081 Allergic rhinitis due to animal (cat) (dog) hair and dander: Secondary | ICD-10-CM | POA: Diagnosis not present

## 2016-12-02 DIAGNOSIS — J301 Allergic rhinitis due to pollen: Secondary | ICD-10-CM | POA: Diagnosis not present

## 2016-12-04 DIAGNOSIS — J3081 Allergic rhinitis due to animal (cat) (dog) hair and dander: Secondary | ICD-10-CM | POA: Diagnosis not present

## 2016-12-04 DIAGNOSIS — J3089 Other allergic rhinitis: Secondary | ICD-10-CM | POA: Diagnosis not present

## 2016-12-04 DIAGNOSIS — J301 Allergic rhinitis due to pollen: Secondary | ICD-10-CM | POA: Diagnosis not present

## 2016-12-08 DIAGNOSIS — J3081 Allergic rhinitis due to animal (cat) (dog) hair and dander: Secondary | ICD-10-CM | POA: Diagnosis not present

## 2016-12-08 DIAGNOSIS — J3089 Other allergic rhinitis: Secondary | ICD-10-CM | POA: Diagnosis not present

## 2016-12-08 DIAGNOSIS — J301 Allergic rhinitis due to pollen: Secondary | ICD-10-CM | POA: Diagnosis not present

## 2016-12-10 DIAGNOSIS — J3081 Allergic rhinitis due to animal (cat) (dog) hair and dander: Secondary | ICD-10-CM | POA: Diagnosis not present

## 2016-12-10 DIAGNOSIS — J3089 Other allergic rhinitis: Secondary | ICD-10-CM | POA: Diagnosis not present

## 2016-12-10 DIAGNOSIS — J301 Allergic rhinitis due to pollen: Secondary | ICD-10-CM | POA: Diagnosis not present

## 2016-12-14 DIAGNOSIS — J301 Allergic rhinitis due to pollen: Secondary | ICD-10-CM | POA: Diagnosis not present

## 2016-12-14 DIAGNOSIS — J3081 Allergic rhinitis due to animal (cat) (dog) hair and dander: Secondary | ICD-10-CM | POA: Diagnosis not present

## 2016-12-14 DIAGNOSIS — J3089 Other allergic rhinitis: Secondary | ICD-10-CM | POA: Diagnosis not present

## 2016-12-22 DIAGNOSIS — J3089 Other allergic rhinitis: Secondary | ICD-10-CM | POA: Diagnosis not present

## 2016-12-22 DIAGNOSIS — J301 Allergic rhinitis due to pollen: Secondary | ICD-10-CM | POA: Diagnosis not present

## 2016-12-22 DIAGNOSIS — J3081 Allergic rhinitis due to animal (cat) (dog) hair and dander: Secondary | ICD-10-CM | POA: Diagnosis not present

## 2016-12-29 DIAGNOSIS — J301 Allergic rhinitis due to pollen: Secondary | ICD-10-CM | POA: Diagnosis not present

## 2016-12-29 DIAGNOSIS — J3089 Other allergic rhinitis: Secondary | ICD-10-CM | POA: Diagnosis not present

## 2016-12-29 DIAGNOSIS — J3081 Allergic rhinitis due to animal (cat) (dog) hair and dander: Secondary | ICD-10-CM | POA: Diagnosis not present

## 2017-01-06 DIAGNOSIS — J301 Allergic rhinitis due to pollen: Secondary | ICD-10-CM | POA: Diagnosis not present

## 2017-01-06 DIAGNOSIS — J3081 Allergic rhinitis due to animal (cat) (dog) hair and dander: Secondary | ICD-10-CM | POA: Diagnosis not present

## 2017-01-06 DIAGNOSIS — J3089 Other allergic rhinitis: Secondary | ICD-10-CM | POA: Diagnosis not present

## 2017-01-12 DIAGNOSIS — J301 Allergic rhinitis due to pollen: Secondary | ICD-10-CM | POA: Diagnosis not present

## 2017-01-12 DIAGNOSIS — J3081 Allergic rhinitis due to animal (cat) (dog) hair and dander: Secondary | ICD-10-CM | POA: Diagnosis not present

## 2017-01-12 DIAGNOSIS — J3089 Other allergic rhinitis: Secondary | ICD-10-CM | POA: Diagnosis not present

## 2017-01-21 DIAGNOSIS — J301 Allergic rhinitis due to pollen: Secondary | ICD-10-CM | POA: Diagnosis not present

## 2017-01-21 DIAGNOSIS — J3081 Allergic rhinitis due to animal (cat) (dog) hair and dander: Secondary | ICD-10-CM | POA: Diagnosis not present

## 2017-01-21 DIAGNOSIS — J3089 Other allergic rhinitis: Secondary | ICD-10-CM | POA: Diagnosis not present

## 2017-01-25 DIAGNOSIS — J301 Allergic rhinitis due to pollen: Secondary | ICD-10-CM | POA: Diagnosis not present

## 2017-01-25 DIAGNOSIS — J3081 Allergic rhinitis due to animal (cat) (dog) hair and dander: Secondary | ICD-10-CM | POA: Diagnosis not present

## 2017-01-25 DIAGNOSIS — J3089 Other allergic rhinitis: Secondary | ICD-10-CM | POA: Diagnosis not present

## 2017-01-28 DIAGNOSIS — K219 Gastro-esophageal reflux disease without esophagitis: Secondary | ICD-10-CM | POA: Diagnosis not present

## 2017-01-28 DIAGNOSIS — Z0000 Encounter for general adult medical examination without abnormal findings: Secondary | ICD-10-CM | POA: Diagnosis not present

## 2017-01-28 DIAGNOSIS — E611 Iron deficiency: Secondary | ICD-10-CM | POA: Diagnosis not present

## 2017-01-28 DIAGNOSIS — Z1389 Encounter for screening for other disorder: Secondary | ICD-10-CM | POA: Diagnosis not present

## 2017-02-04 DIAGNOSIS — J3089 Other allergic rhinitis: Secondary | ICD-10-CM | POA: Diagnosis not present

## 2017-02-04 DIAGNOSIS — J3081 Allergic rhinitis due to animal (cat) (dog) hair and dander: Secondary | ICD-10-CM | POA: Diagnosis not present

## 2017-02-04 DIAGNOSIS — J301 Allergic rhinitis due to pollen: Secondary | ICD-10-CM | POA: Diagnosis not present

## 2017-02-09 DIAGNOSIS — R55 Syncope and collapse: Secondary | ICD-10-CM | POA: Diagnosis not present

## 2017-02-10 DIAGNOSIS — J301 Allergic rhinitis due to pollen: Secondary | ICD-10-CM | POA: Diagnosis not present

## 2017-02-10 DIAGNOSIS — J3089 Other allergic rhinitis: Secondary | ICD-10-CM | POA: Diagnosis not present

## 2017-02-10 DIAGNOSIS — J3081 Allergic rhinitis due to animal (cat) (dog) hair and dander: Secondary | ICD-10-CM | POA: Diagnosis not present

## 2017-02-16 DIAGNOSIS — J301 Allergic rhinitis due to pollen: Secondary | ICD-10-CM | POA: Diagnosis not present

## 2017-02-16 DIAGNOSIS — J3081 Allergic rhinitis due to animal (cat) (dog) hair and dander: Secondary | ICD-10-CM | POA: Diagnosis not present

## 2017-02-16 DIAGNOSIS — J3089 Other allergic rhinitis: Secondary | ICD-10-CM | POA: Diagnosis not present

## 2017-02-24 DIAGNOSIS — J301 Allergic rhinitis due to pollen: Secondary | ICD-10-CM | POA: Diagnosis not present

## 2017-02-24 DIAGNOSIS — J3089 Other allergic rhinitis: Secondary | ICD-10-CM | POA: Diagnosis not present

## 2017-02-24 DIAGNOSIS — J3081 Allergic rhinitis due to animal (cat) (dog) hair and dander: Secondary | ICD-10-CM | POA: Diagnosis not present

## 2017-03-04 DIAGNOSIS — J301 Allergic rhinitis due to pollen: Secondary | ICD-10-CM | POA: Diagnosis not present

## 2017-03-04 DIAGNOSIS — J3089 Other allergic rhinitis: Secondary | ICD-10-CM | POA: Diagnosis not present

## 2017-03-04 DIAGNOSIS — J3081 Allergic rhinitis due to animal (cat) (dog) hair and dander: Secondary | ICD-10-CM | POA: Diagnosis not present

## 2017-03-09 DIAGNOSIS — J301 Allergic rhinitis due to pollen: Secondary | ICD-10-CM | POA: Diagnosis not present

## 2017-03-09 DIAGNOSIS — J3089 Other allergic rhinitis: Secondary | ICD-10-CM | POA: Diagnosis not present

## 2017-03-09 DIAGNOSIS — J3081 Allergic rhinitis due to animal (cat) (dog) hair and dander: Secondary | ICD-10-CM | POA: Diagnosis not present

## 2017-03-19 DIAGNOSIS — J3089 Other allergic rhinitis: Secondary | ICD-10-CM | POA: Diagnosis not present

## 2017-03-19 DIAGNOSIS — J301 Allergic rhinitis due to pollen: Secondary | ICD-10-CM | POA: Diagnosis not present

## 2017-03-19 DIAGNOSIS — J3081 Allergic rhinitis due to animal (cat) (dog) hair and dander: Secondary | ICD-10-CM | POA: Diagnosis not present

## 2017-03-25 DIAGNOSIS — J3081 Allergic rhinitis due to animal (cat) (dog) hair and dander: Secondary | ICD-10-CM | POA: Diagnosis not present

## 2017-03-25 DIAGNOSIS — J3089 Other allergic rhinitis: Secondary | ICD-10-CM | POA: Diagnosis not present

## 2017-03-25 DIAGNOSIS — J301 Allergic rhinitis due to pollen: Secondary | ICD-10-CM | POA: Diagnosis not present

## 2017-03-26 DIAGNOSIS — Z961 Presence of intraocular lens: Secondary | ICD-10-CM | POA: Diagnosis not present

## 2017-03-26 DIAGNOSIS — H26492 Other secondary cataract, left eye: Secondary | ICD-10-CM | POA: Diagnosis not present

## 2017-03-30 DIAGNOSIS — J301 Allergic rhinitis due to pollen: Secondary | ICD-10-CM | POA: Diagnosis not present

## 2017-03-30 DIAGNOSIS — J3081 Allergic rhinitis due to animal (cat) (dog) hair and dander: Secondary | ICD-10-CM | POA: Diagnosis not present

## 2017-03-30 DIAGNOSIS — J3089 Other allergic rhinitis: Secondary | ICD-10-CM | POA: Diagnosis not present

## 2017-04-07 DIAGNOSIS — J3081 Allergic rhinitis due to animal (cat) (dog) hair and dander: Secondary | ICD-10-CM | POA: Diagnosis not present

## 2017-04-07 DIAGNOSIS — J301 Allergic rhinitis due to pollen: Secondary | ICD-10-CM | POA: Diagnosis not present

## 2017-04-07 DIAGNOSIS — J3089 Other allergic rhinitis: Secondary | ICD-10-CM | POA: Diagnosis not present

## 2017-04-15 DIAGNOSIS — R062 Wheezing: Secondary | ICD-10-CM | POA: Diagnosis not present

## 2017-04-15 DIAGNOSIS — J301 Allergic rhinitis due to pollen: Secondary | ICD-10-CM | POA: Diagnosis not present

## 2017-04-15 DIAGNOSIS — J3089 Other allergic rhinitis: Secondary | ICD-10-CM | POA: Diagnosis not present

## 2017-04-15 DIAGNOSIS — T781XXA Other adverse food reactions, not elsewhere classified, initial encounter: Secondary | ICD-10-CM | POA: Diagnosis not present

## 2017-04-22 DIAGNOSIS — J301 Allergic rhinitis due to pollen: Secondary | ICD-10-CM | POA: Diagnosis not present

## 2017-04-22 DIAGNOSIS — J3089 Other allergic rhinitis: Secondary | ICD-10-CM | POA: Diagnosis not present

## 2017-04-22 DIAGNOSIS — J3081 Allergic rhinitis due to animal (cat) (dog) hair and dander: Secondary | ICD-10-CM | POA: Diagnosis not present

## 2017-04-26 DIAGNOSIS — S76211A Strain of adductor muscle, fascia and tendon of right thigh, initial encounter: Secondary | ICD-10-CM | POA: Diagnosis not present

## 2017-05-03 DIAGNOSIS — M1611 Unilateral primary osteoarthritis, right hip: Secondary | ICD-10-CM | POA: Diagnosis not present

## 2017-05-03 DIAGNOSIS — M25551 Pain in right hip: Secondary | ICD-10-CM | POA: Diagnosis not present

## 2017-05-04 DIAGNOSIS — M25551 Pain in right hip: Secondary | ICD-10-CM | POA: Diagnosis not present

## 2017-05-05 DIAGNOSIS — J301 Allergic rhinitis due to pollen: Secondary | ICD-10-CM | POA: Diagnosis not present

## 2017-05-05 DIAGNOSIS — M1611 Unilateral primary osteoarthritis, right hip: Secondary | ICD-10-CM | POA: Diagnosis not present

## 2017-05-05 DIAGNOSIS — M25551 Pain in right hip: Secondary | ICD-10-CM | POA: Diagnosis not present

## 2017-05-05 DIAGNOSIS — J3081 Allergic rhinitis due to animal (cat) (dog) hair and dander: Secondary | ICD-10-CM | POA: Diagnosis not present

## 2017-05-05 DIAGNOSIS — J3089 Other allergic rhinitis: Secondary | ICD-10-CM | POA: Diagnosis not present

## 2017-05-06 DIAGNOSIS — M25551 Pain in right hip: Secondary | ICD-10-CM | POA: Diagnosis not present

## 2017-05-06 DIAGNOSIS — M1611 Unilateral primary osteoarthritis, right hip: Secondary | ICD-10-CM | POA: Diagnosis not present

## 2017-05-11 DIAGNOSIS — M1611 Unilateral primary osteoarthritis, right hip: Secondary | ICD-10-CM | POA: Diagnosis not present

## 2017-05-11 DIAGNOSIS — M25551 Pain in right hip: Secondary | ICD-10-CM | POA: Diagnosis not present

## 2017-05-12 DIAGNOSIS — M25551 Pain in right hip: Secondary | ICD-10-CM | POA: Diagnosis not present

## 2017-05-21 DIAGNOSIS — J3089 Other allergic rhinitis: Secondary | ICD-10-CM | POA: Diagnosis not present

## 2017-05-21 DIAGNOSIS — J301 Allergic rhinitis due to pollen: Secondary | ICD-10-CM | POA: Diagnosis not present

## 2017-05-21 DIAGNOSIS — J3081 Allergic rhinitis due to animal (cat) (dog) hair and dander: Secondary | ICD-10-CM | POA: Diagnosis not present

## 2017-05-24 DIAGNOSIS — J3089 Other allergic rhinitis: Secondary | ICD-10-CM | POA: Diagnosis not present

## 2017-05-24 DIAGNOSIS — J3081 Allergic rhinitis due to animal (cat) (dog) hair and dander: Secondary | ICD-10-CM | POA: Diagnosis not present

## 2017-05-24 DIAGNOSIS — J301 Allergic rhinitis due to pollen: Secondary | ICD-10-CM | POA: Diagnosis not present

## 2017-05-25 DIAGNOSIS — M25551 Pain in right hip: Secondary | ICD-10-CM | POA: Diagnosis not present

## 2017-05-26 DIAGNOSIS — J343 Hypertrophy of nasal turbinates: Secondary | ICD-10-CM | POA: Diagnosis not present

## 2017-05-26 DIAGNOSIS — J3089 Other allergic rhinitis: Secondary | ICD-10-CM | POA: Diagnosis not present

## 2017-05-26 DIAGNOSIS — J3081 Allergic rhinitis due to animal (cat) (dog) hair and dander: Secondary | ICD-10-CM | POA: Diagnosis not present

## 2017-05-26 DIAGNOSIS — J301 Allergic rhinitis due to pollen: Secondary | ICD-10-CM | POA: Diagnosis not present

## 2017-05-26 DIAGNOSIS — J342 Deviated nasal septum: Secondary | ICD-10-CM | POA: Diagnosis not present

## 2017-05-27 DIAGNOSIS — J301 Allergic rhinitis due to pollen: Secondary | ICD-10-CM | POA: Diagnosis not present

## 2017-05-27 DIAGNOSIS — J3089 Other allergic rhinitis: Secondary | ICD-10-CM | POA: Diagnosis not present

## 2017-06-01 DIAGNOSIS — J3089 Other allergic rhinitis: Secondary | ICD-10-CM | POA: Diagnosis not present

## 2017-06-01 DIAGNOSIS — J301 Allergic rhinitis due to pollen: Secondary | ICD-10-CM | POA: Diagnosis not present

## 2017-06-01 DIAGNOSIS — J3081 Allergic rhinitis due to animal (cat) (dog) hair and dander: Secondary | ICD-10-CM | POA: Diagnosis not present

## 2017-06-03 DIAGNOSIS — J3081 Allergic rhinitis due to animal (cat) (dog) hair and dander: Secondary | ICD-10-CM | POA: Diagnosis not present

## 2017-06-03 DIAGNOSIS — J3089 Other allergic rhinitis: Secondary | ICD-10-CM | POA: Diagnosis not present

## 2017-06-03 DIAGNOSIS — M25512 Pain in left shoulder: Secondary | ICD-10-CM | POA: Diagnosis not present

## 2017-06-03 DIAGNOSIS — J301 Allergic rhinitis due to pollen: Secondary | ICD-10-CM | POA: Diagnosis not present

## 2017-06-08 DIAGNOSIS — R69 Illness, unspecified: Secondary | ICD-10-CM | POA: Diagnosis not present

## 2017-06-10 DIAGNOSIS — J301 Allergic rhinitis due to pollen: Secondary | ICD-10-CM | POA: Diagnosis not present

## 2017-06-10 DIAGNOSIS — J3089 Other allergic rhinitis: Secondary | ICD-10-CM | POA: Diagnosis not present

## 2017-06-10 DIAGNOSIS — J3081 Allergic rhinitis due to animal (cat) (dog) hair and dander: Secondary | ICD-10-CM | POA: Diagnosis not present

## 2017-06-15 DIAGNOSIS — J301 Allergic rhinitis due to pollen: Secondary | ICD-10-CM | POA: Diagnosis not present

## 2017-06-15 DIAGNOSIS — J3081 Allergic rhinitis due to animal (cat) (dog) hair and dander: Secondary | ICD-10-CM | POA: Diagnosis not present

## 2017-06-15 DIAGNOSIS — J3089 Other allergic rhinitis: Secondary | ICD-10-CM | POA: Diagnosis not present

## 2017-06-24 DIAGNOSIS — J3089 Other allergic rhinitis: Secondary | ICD-10-CM | POA: Diagnosis not present

## 2017-06-24 DIAGNOSIS — J3081 Allergic rhinitis due to animal (cat) (dog) hair and dander: Secondary | ICD-10-CM | POA: Diagnosis not present

## 2017-06-24 DIAGNOSIS — J301 Allergic rhinitis due to pollen: Secondary | ICD-10-CM | POA: Diagnosis not present

## 2017-06-29 DIAGNOSIS — J301 Allergic rhinitis due to pollen: Secondary | ICD-10-CM | POA: Diagnosis not present

## 2017-06-29 DIAGNOSIS — J3081 Allergic rhinitis due to animal (cat) (dog) hair and dander: Secondary | ICD-10-CM | POA: Diagnosis not present

## 2017-06-29 DIAGNOSIS — J3089 Other allergic rhinitis: Secondary | ICD-10-CM | POA: Diagnosis not present

## 2017-07-07 DIAGNOSIS — J3089 Other allergic rhinitis: Secondary | ICD-10-CM | POA: Diagnosis not present

## 2017-07-07 DIAGNOSIS — J301 Allergic rhinitis due to pollen: Secondary | ICD-10-CM | POA: Diagnosis not present

## 2017-07-07 DIAGNOSIS — J3081 Allergic rhinitis due to animal (cat) (dog) hair and dander: Secondary | ICD-10-CM | POA: Diagnosis not present

## 2017-07-12 DIAGNOSIS — J3081 Allergic rhinitis due to animal (cat) (dog) hair and dander: Secondary | ICD-10-CM | POA: Diagnosis not present

## 2017-07-12 DIAGNOSIS — J301 Allergic rhinitis due to pollen: Secondary | ICD-10-CM | POA: Diagnosis not present

## 2017-07-12 DIAGNOSIS — J3089 Other allergic rhinitis: Secondary | ICD-10-CM | POA: Diagnosis not present

## 2017-07-28 DIAGNOSIS — J301 Allergic rhinitis due to pollen: Secondary | ICD-10-CM | POA: Diagnosis not present

## 2017-07-28 DIAGNOSIS — J3089 Other allergic rhinitis: Secondary | ICD-10-CM | POA: Diagnosis not present

## 2017-07-28 DIAGNOSIS — J3081 Allergic rhinitis due to animal (cat) (dog) hair and dander: Secondary | ICD-10-CM | POA: Diagnosis not present

## 2017-08-05 DIAGNOSIS — J3081 Allergic rhinitis due to animal (cat) (dog) hair and dander: Secondary | ICD-10-CM | POA: Diagnosis not present

## 2017-08-05 DIAGNOSIS — J301 Allergic rhinitis due to pollen: Secondary | ICD-10-CM | POA: Diagnosis not present

## 2017-08-05 DIAGNOSIS — J3089 Other allergic rhinitis: Secondary | ICD-10-CM | POA: Diagnosis not present

## 2017-08-12 DIAGNOSIS — J3089 Other allergic rhinitis: Secondary | ICD-10-CM | POA: Diagnosis not present

## 2017-08-12 DIAGNOSIS — J3081 Allergic rhinitis due to animal (cat) (dog) hair and dander: Secondary | ICD-10-CM | POA: Diagnosis not present

## 2017-08-12 DIAGNOSIS — J301 Allergic rhinitis due to pollen: Secondary | ICD-10-CM | POA: Diagnosis not present

## 2017-08-19 DIAGNOSIS — J3081 Allergic rhinitis due to animal (cat) (dog) hair and dander: Secondary | ICD-10-CM | POA: Diagnosis not present

## 2017-08-19 DIAGNOSIS — J301 Allergic rhinitis due to pollen: Secondary | ICD-10-CM | POA: Diagnosis not present

## 2017-08-19 DIAGNOSIS — J3089 Other allergic rhinitis: Secondary | ICD-10-CM | POA: Diagnosis not present

## 2017-08-25 DIAGNOSIS — R062 Wheezing: Secondary | ICD-10-CM | POA: Diagnosis not present

## 2017-08-25 DIAGNOSIS — J3089 Other allergic rhinitis: Secondary | ICD-10-CM | POA: Diagnosis not present

## 2017-08-25 DIAGNOSIS — T781XXA Other adverse food reactions, not elsewhere classified, initial encounter: Secondary | ICD-10-CM | POA: Diagnosis not present

## 2017-08-25 DIAGNOSIS — J301 Allergic rhinitis due to pollen: Secondary | ICD-10-CM | POA: Diagnosis not present

## 2017-09-02 DIAGNOSIS — J3081 Allergic rhinitis due to animal (cat) (dog) hair and dander: Secondary | ICD-10-CM | POA: Diagnosis not present

## 2017-09-02 DIAGNOSIS — J3089 Other allergic rhinitis: Secondary | ICD-10-CM | POA: Diagnosis not present

## 2017-09-02 DIAGNOSIS — J301 Allergic rhinitis due to pollen: Secondary | ICD-10-CM | POA: Diagnosis not present

## 2017-09-09 DIAGNOSIS — J3089 Other allergic rhinitis: Secondary | ICD-10-CM | POA: Diagnosis not present

## 2017-09-09 DIAGNOSIS — J3081 Allergic rhinitis due to animal (cat) (dog) hair and dander: Secondary | ICD-10-CM | POA: Diagnosis not present

## 2017-09-09 DIAGNOSIS — J301 Allergic rhinitis due to pollen: Secondary | ICD-10-CM | POA: Diagnosis not present

## 2017-09-17 DIAGNOSIS — J301 Allergic rhinitis due to pollen: Secondary | ICD-10-CM | POA: Diagnosis not present

## 2017-09-17 DIAGNOSIS — J3089 Other allergic rhinitis: Secondary | ICD-10-CM | POA: Diagnosis not present

## 2017-09-17 DIAGNOSIS — J3081 Allergic rhinitis due to animal (cat) (dog) hair and dander: Secondary | ICD-10-CM | POA: Diagnosis not present

## 2017-09-21 DIAGNOSIS — J301 Allergic rhinitis due to pollen: Secondary | ICD-10-CM | POA: Diagnosis not present

## 2017-09-21 DIAGNOSIS — J3081 Allergic rhinitis due to animal (cat) (dog) hair and dander: Secondary | ICD-10-CM | POA: Diagnosis not present

## 2017-09-21 DIAGNOSIS — J3089 Other allergic rhinitis: Secondary | ICD-10-CM | POA: Diagnosis not present

## 2017-09-28 DIAGNOSIS — J3089 Other allergic rhinitis: Secondary | ICD-10-CM | POA: Diagnosis not present

## 2017-09-28 DIAGNOSIS — J3081 Allergic rhinitis due to animal (cat) (dog) hair and dander: Secondary | ICD-10-CM | POA: Diagnosis not present

## 2017-09-28 DIAGNOSIS — J301 Allergic rhinitis due to pollen: Secondary | ICD-10-CM | POA: Diagnosis not present

## 2017-10-07 DIAGNOSIS — J3089 Other allergic rhinitis: Secondary | ICD-10-CM | POA: Diagnosis not present

## 2017-10-07 DIAGNOSIS — J3081 Allergic rhinitis due to animal (cat) (dog) hair and dander: Secondary | ICD-10-CM | POA: Diagnosis not present

## 2017-10-07 DIAGNOSIS — J301 Allergic rhinitis due to pollen: Secondary | ICD-10-CM | POA: Diagnosis not present

## 2017-10-15 DIAGNOSIS — J301 Allergic rhinitis due to pollen: Secondary | ICD-10-CM | POA: Diagnosis not present

## 2017-10-15 DIAGNOSIS — J3081 Allergic rhinitis due to animal (cat) (dog) hair and dander: Secondary | ICD-10-CM | POA: Diagnosis not present

## 2017-10-15 DIAGNOSIS — J3089 Other allergic rhinitis: Secondary | ICD-10-CM | POA: Diagnosis not present

## 2017-10-19 DIAGNOSIS — J3081 Allergic rhinitis due to animal (cat) (dog) hair and dander: Secondary | ICD-10-CM | POA: Diagnosis not present

## 2017-10-19 DIAGNOSIS — J301 Allergic rhinitis due to pollen: Secondary | ICD-10-CM | POA: Diagnosis not present

## 2017-10-19 DIAGNOSIS — J3089 Other allergic rhinitis: Secondary | ICD-10-CM | POA: Diagnosis not present

## 2017-10-21 DIAGNOSIS — J3081 Allergic rhinitis due to animal (cat) (dog) hair and dander: Secondary | ICD-10-CM | POA: Diagnosis not present

## 2017-10-21 DIAGNOSIS — J3089 Other allergic rhinitis: Secondary | ICD-10-CM | POA: Diagnosis not present

## 2017-10-21 DIAGNOSIS — J301 Allergic rhinitis due to pollen: Secondary | ICD-10-CM | POA: Diagnosis not present

## 2017-10-28 DIAGNOSIS — T781XXA Other adverse food reactions, not elsewhere classified, initial encounter: Secondary | ICD-10-CM | POA: Diagnosis not present

## 2017-10-28 DIAGNOSIS — J301 Allergic rhinitis due to pollen: Secondary | ICD-10-CM | POA: Diagnosis not present

## 2017-10-28 DIAGNOSIS — J3089 Other allergic rhinitis: Secondary | ICD-10-CM | POA: Diagnosis not present

## 2017-10-28 DIAGNOSIS — R062 Wheezing: Secondary | ICD-10-CM | POA: Diagnosis not present

## 2017-11-03 DIAGNOSIS — J343 Hypertrophy of nasal turbinates: Secondary | ICD-10-CM | POA: Diagnosis not present

## 2017-11-03 DIAGNOSIS — J301 Allergic rhinitis due to pollen: Secondary | ICD-10-CM | POA: Diagnosis not present

## 2017-11-03 DIAGNOSIS — J3081 Allergic rhinitis due to animal (cat) (dog) hair and dander: Secondary | ICD-10-CM | POA: Diagnosis not present

## 2017-11-03 DIAGNOSIS — J3089 Other allergic rhinitis: Secondary | ICD-10-CM | POA: Diagnosis not present

## 2017-11-03 DIAGNOSIS — J342 Deviated nasal septum: Secondary | ICD-10-CM | POA: Diagnosis not present

## 2017-11-05 DIAGNOSIS — J343 Hypertrophy of nasal turbinates: Secondary | ICD-10-CM | POA: Diagnosis not present

## 2017-11-05 DIAGNOSIS — J342 Deviated nasal septum: Secondary | ICD-10-CM | POA: Diagnosis not present

## 2017-11-10 DIAGNOSIS — J301 Allergic rhinitis due to pollen: Secondary | ICD-10-CM | POA: Diagnosis not present

## 2017-11-10 DIAGNOSIS — J3081 Allergic rhinitis due to animal (cat) (dog) hair and dander: Secondary | ICD-10-CM | POA: Diagnosis not present

## 2017-11-10 DIAGNOSIS — J3089 Other allergic rhinitis: Secondary | ICD-10-CM | POA: Diagnosis not present

## 2017-11-17 DIAGNOSIS — J3089 Other allergic rhinitis: Secondary | ICD-10-CM | POA: Diagnosis not present

## 2017-11-17 DIAGNOSIS — J3081 Allergic rhinitis due to animal (cat) (dog) hair and dander: Secondary | ICD-10-CM | POA: Diagnosis not present

## 2017-11-17 DIAGNOSIS — J301 Allergic rhinitis due to pollen: Secondary | ICD-10-CM | POA: Diagnosis not present

## 2017-11-19 DIAGNOSIS — J3081 Allergic rhinitis due to animal (cat) (dog) hair and dander: Secondary | ICD-10-CM | POA: Diagnosis not present

## 2017-11-19 DIAGNOSIS — J301 Allergic rhinitis due to pollen: Secondary | ICD-10-CM | POA: Diagnosis not present

## 2017-11-19 DIAGNOSIS — J3089 Other allergic rhinitis: Secondary | ICD-10-CM | POA: Diagnosis not present

## 2017-11-24 DIAGNOSIS — J3081 Allergic rhinitis due to animal (cat) (dog) hair and dander: Secondary | ICD-10-CM | POA: Diagnosis not present

## 2017-11-24 DIAGNOSIS — J3089 Other allergic rhinitis: Secondary | ICD-10-CM | POA: Diagnosis not present

## 2017-11-24 DIAGNOSIS — J301 Allergic rhinitis due to pollen: Secondary | ICD-10-CM | POA: Diagnosis not present

## 2017-11-26 DIAGNOSIS — J301 Allergic rhinitis due to pollen: Secondary | ICD-10-CM | POA: Diagnosis not present

## 2017-11-26 DIAGNOSIS — J3081 Allergic rhinitis due to animal (cat) (dog) hair and dander: Secondary | ICD-10-CM | POA: Diagnosis not present

## 2017-11-26 DIAGNOSIS — J3089 Other allergic rhinitis: Secondary | ICD-10-CM | POA: Diagnosis not present

## 2017-12-01 DIAGNOSIS — J301 Allergic rhinitis due to pollen: Secondary | ICD-10-CM | POA: Diagnosis not present

## 2017-12-01 DIAGNOSIS — J3089 Other allergic rhinitis: Secondary | ICD-10-CM | POA: Diagnosis not present

## 2017-12-01 DIAGNOSIS — J3081 Allergic rhinitis due to animal (cat) (dog) hair and dander: Secondary | ICD-10-CM | POA: Diagnosis not present

## 2017-12-08 DIAGNOSIS — J3089 Other allergic rhinitis: Secondary | ICD-10-CM | POA: Diagnosis not present

## 2017-12-08 DIAGNOSIS — J301 Allergic rhinitis due to pollen: Secondary | ICD-10-CM | POA: Diagnosis not present

## 2017-12-08 DIAGNOSIS — J3081 Allergic rhinitis due to animal (cat) (dog) hair and dander: Secondary | ICD-10-CM | POA: Diagnosis not present

## 2017-12-14 DIAGNOSIS — J301 Allergic rhinitis due to pollen: Secondary | ICD-10-CM | POA: Diagnosis not present

## 2017-12-14 DIAGNOSIS — J3081 Allergic rhinitis due to animal (cat) (dog) hair and dander: Secondary | ICD-10-CM | POA: Diagnosis not present

## 2017-12-14 DIAGNOSIS — J3089 Other allergic rhinitis: Secondary | ICD-10-CM | POA: Diagnosis not present

## 2017-12-21 DIAGNOSIS — R69 Illness, unspecified: Secondary | ICD-10-CM | POA: Diagnosis not present

## 2017-12-21 DIAGNOSIS — J301 Allergic rhinitis due to pollen: Secondary | ICD-10-CM | POA: Diagnosis not present

## 2017-12-21 DIAGNOSIS — J3081 Allergic rhinitis due to animal (cat) (dog) hair and dander: Secondary | ICD-10-CM | POA: Diagnosis not present

## 2017-12-21 DIAGNOSIS — J3089 Other allergic rhinitis: Secondary | ICD-10-CM | POA: Diagnosis not present

## 2018-01-03 DIAGNOSIS — J301 Allergic rhinitis due to pollen: Secondary | ICD-10-CM | POA: Diagnosis not present

## 2018-01-03 DIAGNOSIS — J3089 Other allergic rhinitis: Secondary | ICD-10-CM | POA: Diagnosis not present

## 2018-01-03 DIAGNOSIS — J3081 Allergic rhinitis due to animal (cat) (dog) hair and dander: Secondary | ICD-10-CM | POA: Diagnosis not present

## 2018-01-11 DIAGNOSIS — J3081 Allergic rhinitis due to animal (cat) (dog) hair and dander: Secondary | ICD-10-CM | POA: Diagnosis not present

## 2018-01-11 DIAGNOSIS — J3089 Other allergic rhinitis: Secondary | ICD-10-CM | POA: Diagnosis not present

## 2018-01-11 DIAGNOSIS — J301 Allergic rhinitis due to pollen: Secondary | ICD-10-CM | POA: Diagnosis not present

## 2018-01-19 DIAGNOSIS — J301 Allergic rhinitis due to pollen: Secondary | ICD-10-CM | POA: Diagnosis not present

## 2018-01-19 DIAGNOSIS — J3081 Allergic rhinitis due to animal (cat) (dog) hair and dander: Secondary | ICD-10-CM | POA: Diagnosis not present

## 2018-01-19 DIAGNOSIS — J3089 Other allergic rhinitis: Secondary | ICD-10-CM | POA: Diagnosis not present

## 2018-01-26 DIAGNOSIS — K449 Diaphragmatic hernia without obstruction or gangrene: Secondary | ICD-10-CM | POA: Diagnosis not present

## 2018-01-26 DIAGNOSIS — J3081 Allergic rhinitis due to animal (cat) (dog) hair and dander: Secondary | ICD-10-CM | POA: Diagnosis not present

## 2018-01-26 DIAGNOSIS — J3089 Other allergic rhinitis: Secondary | ICD-10-CM | POA: Diagnosis not present

## 2018-01-26 DIAGNOSIS — J301 Allergic rhinitis due to pollen: Secondary | ICD-10-CM | POA: Diagnosis not present

## 2018-01-26 DIAGNOSIS — Z1210 Encounter for screening for malignant neoplasm of intestinal tract, unspecified: Secondary | ICD-10-CM | POA: Diagnosis not present

## 2018-01-26 DIAGNOSIS — K222 Esophageal obstruction: Secondary | ICD-10-CM | POA: Diagnosis not present

## 2018-02-01 DIAGNOSIS — Z136 Encounter for screening for cardiovascular disorders: Secondary | ICD-10-CM | POA: Diagnosis not present

## 2018-02-01 DIAGNOSIS — Z0000 Encounter for general adult medical examination without abnormal findings: Secondary | ICD-10-CM | POA: Diagnosis not present

## 2018-02-01 DIAGNOSIS — Z1389 Encounter for screening for other disorder: Secondary | ICD-10-CM | POA: Diagnosis not present

## 2018-02-03 DIAGNOSIS — J301 Allergic rhinitis due to pollen: Secondary | ICD-10-CM | POA: Diagnosis not present

## 2018-02-03 DIAGNOSIS — J3089 Other allergic rhinitis: Secondary | ICD-10-CM | POA: Diagnosis not present

## 2018-02-03 DIAGNOSIS — J3081 Allergic rhinitis due to animal (cat) (dog) hair and dander: Secondary | ICD-10-CM | POA: Diagnosis not present

## 2018-02-11 DIAGNOSIS — J3089 Other allergic rhinitis: Secondary | ICD-10-CM | POA: Diagnosis not present

## 2018-02-11 DIAGNOSIS — J3081 Allergic rhinitis due to animal (cat) (dog) hair and dander: Secondary | ICD-10-CM | POA: Diagnosis not present

## 2018-02-11 DIAGNOSIS — J301 Allergic rhinitis due to pollen: Secondary | ICD-10-CM | POA: Diagnosis not present

## 2018-02-14 DIAGNOSIS — J3089 Other allergic rhinitis: Secondary | ICD-10-CM | POA: Diagnosis not present

## 2018-02-14 DIAGNOSIS — J3081 Allergic rhinitis due to animal (cat) (dog) hair and dander: Secondary | ICD-10-CM | POA: Diagnosis not present

## 2018-02-14 DIAGNOSIS — J301 Allergic rhinitis due to pollen: Secondary | ICD-10-CM | POA: Diagnosis not present

## 2018-02-24 DIAGNOSIS — J301 Allergic rhinitis due to pollen: Secondary | ICD-10-CM | POA: Diagnosis not present

## 2018-02-24 DIAGNOSIS — J3089 Other allergic rhinitis: Secondary | ICD-10-CM | POA: Diagnosis not present

## 2018-02-24 DIAGNOSIS — J3081 Allergic rhinitis due to animal (cat) (dog) hair and dander: Secondary | ICD-10-CM | POA: Diagnosis not present

## 2018-03-04 DIAGNOSIS — J3089 Other allergic rhinitis: Secondary | ICD-10-CM | POA: Diagnosis not present

## 2018-03-04 DIAGNOSIS — J301 Allergic rhinitis due to pollen: Secondary | ICD-10-CM | POA: Diagnosis not present

## 2018-03-04 DIAGNOSIS — J3081 Allergic rhinitis due to animal (cat) (dog) hair and dander: Secondary | ICD-10-CM | POA: Diagnosis not present

## 2018-03-08 DIAGNOSIS — J3089 Other allergic rhinitis: Secondary | ICD-10-CM | POA: Diagnosis not present

## 2018-03-08 DIAGNOSIS — J301 Allergic rhinitis due to pollen: Secondary | ICD-10-CM | POA: Diagnosis not present

## 2018-03-08 DIAGNOSIS — J3081 Allergic rhinitis due to animal (cat) (dog) hair and dander: Secondary | ICD-10-CM | POA: Diagnosis not present

## 2018-03-14 DIAGNOSIS — M21962 Unspecified acquired deformity of left lower leg: Secondary | ICD-10-CM | POA: Diagnosis not present

## 2018-03-14 DIAGNOSIS — M722 Plantar fascial fibromatosis: Secondary | ICD-10-CM | POA: Diagnosis not present

## 2018-03-14 DIAGNOSIS — M79672 Pain in left foot: Secondary | ICD-10-CM | POA: Diagnosis not present

## 2018-03-14 DIAGNOSIS — J301 Allergic rhinitis due to pollen: Secondary | ICD-10-CM | POA: Diagnosis not present

## 2018-03-14 DIAGNOSIS — J3081 Allergic rhinitis due to animal (cat) (dog) hair and dander: Secondary | ICD-10-CM | POA: Diagnosis not present

## 2018-03-14 DIAGNOSIS — J3089 Other allergic rhinitis: Secondary | ICD-10-CM | POA: Diagnosis not present

## 2018-03-21 DIAGNOSIS — J3081 Allergic rhinitis due to animal (cat) (dog) hair and dander: Secondary | ICD-10-CM | POA: Diagnosis not present

## 2018-03-21 DIAGNOSIS — J301 Allergic rhinitis due to pollen: Secondary | ICD-10-CM | POA: Diagnosis not present

## 2018-03-21 DIAGNOSIS — J3089 Other allergic rhinitis: Secondary | ICD-10-CM | POA: Diagnosis not present

## 2018-03-24 DIAGNOSIS — J3081 Allergic rhinitis due to animal (cat) (dog) hair and dander: Secondary | ICD-10-CM | POA: Diagnosis not present

## 2018-03-24 DIAGNOSIS — J301 Allergic rhinitis due to pollen: Secondary | ICD-10-CM | POA: Diagnosis not present

## 2018-03-24 DIAGNOSIS — J3089 Other allergic rhinitis: Secondary | ICD-10-CM | POA: Diagnosis not present

## 2018-03-31 DIAGNOSIS — D123 Benign neoplasm of transverse colon: Secondary | ICD-10-CM | POA: Diagnosis not present

## 2018-03-31 DIAGNOSIS — Z1211 Encounter for screening for malignant neoplasm of colon: Secondary | ICD-10-CM | POA: Diagnosis not present

## 2018-03-31 DIAGNOSIS — K5730 Diverticulosis of large intestine without perforation or abscess without bleeding: Secondary | ICD-10-CM | POA: Diagnosis not present

## 2018-04-05 DIAGNOSIS — M722 Plantar fascial fibromatosis: Secondary | ICD-10-CM | POA: Diagnosis not present

## 2018-04-05 DIAGNOSIS — D123 Benign neoplasm of transverse colon: Secondary | ICD-10-CM | POA: Diagnosis not present

## 2018-04-07 DIAGNOSIS — J3089 Other allergic rhinitis: Secondary | ICD-10-CM | POA: Diagnosis not present

## 2018-04-07 DIAGNOSIS — J3081 Allergic rhinitis due to animal (cat) (dog) hair and dander: Secondary | ICD-10-CM | POA: Diagnosis not present

## 2018-04-07 DIAGNOSIS — J301 Allergic rhinitis due to pollen: Secondary | ICD-10-CM | POA: Diagnosis not present

## 2018-04-14 DIAGNOSIS — J3089 Other allergic rhinitis: Secondary | ICD-10-CM | POA: Diagnosis not present

## 2018-04-14 DIAGNOSIS — T781XXD Other adverse food reactions, not elsewhere classified, subsequent encounter: Secondary | ICD-10-CM | POA: Diagnosis not present

## 2018-04-14 DIAGNOSIS — R062 Wheezing: Secondary | ICD-10-CM | POA: Diagnosis not present

## 2018-04-14 DIAGNOSIS — J301 Allergic rhinitis due to pollen: Secondary | ICD-10-CM | POA: Diagnosis not present

## 2018-04-14 DIAGNOSIS — J3081 Allergic rhinitis due to animal (cat) (dog) hair and dander: Secondary | ICD-10-CM | POA: Diagnosis not present

## 2018-04-21 DIAGNOSIS — J3089 Other allergic rhinitis: Secondary | ICD-10-CM | POA: Diagnosis not present

## 2018-04-21 DIAGNOSIS — J3081 Allergic rhinitis due to animal (cat) (dog) hair and dander: Secondary | ICD-10-CM | POA: Diagnosis not present

## 2018-04-21 DIAGNOSIS — J301 Allergic rhinitis due to pollen: Secondary | ICD-10-CM | POA: Diagnosis not present

## 2018-04-26 DIAGNOSIS — M722 Plantar fascial fibromatosis: Secondary | ICD-10-CM | POA: Diagnosis not present

## 2018-04-26 DIAGNOSIS — M21962 Unspecified acquired deformity of left lower leg: Secondary | ICD-10-CM | POA: Diagnosis not present

## 2018-04-28 DIAGNOSIS — H43812 Vitreous degeneration, left eye: Secondary | ICD-10-CM | POA: Diagnosis not present

## 2018-04-28 DIAGNOSIS — Z961 Presence of intraocular lens: Secondary | ICD-10-CM | POA: Diagnosis not present

## 2018-04-29 DIAGNOSIS — J3089 Other allergic rhinitis: Secondary | ICD-10-CM | POA: Diagnosis not present

## 2018-04-29 DIAGNOSIS — J3081 Allergic rhinitis due to animal (cat) (dog) hair and dander: Secondary | ICD-10-CM | POA: Diagnosis not present

## 2018-04-29 DIAGNOSIS — J301 Allergic rhinitis due to pollen: Secondary | ICD-10-CM | POA: Diagnosis not present

## 2018-05-03 DIAGNOSIS — J301 Allergic rhinitis due to pollen: Secondary | ICD-10-CM | POA: Diagnosis not present

## 2018-05-03 DIAGNOSIS — J3081 Allergic rhinitis due to animal (cat) (dog) hair and dander: Secondary | ICD-10-CM | POA: Diagnosis not present

## 2018-05-03 DIAGNOSIS — J3089 Other allergic rhinitis: Secondary | ICD-10-CM | POA: Diagnosis not present

## 2018-05-05 DIAGNOSIS — J301 Allergic rhinitis due to pollen: Secondary | ICD-10-CM | POA: Diagnosis not present

## 2018-05-05 DIAGNOSIS — J3081 Allergic rhinitis due to animal (cat) (dog) hair and dander: Secondary | ICD-10-CM | POA: Diagnosis not present

## 2018-05-05 DIAGNOSIS — J3089 Other allergic rhinitis: Secondary | ICD-10-CM | POA: Diagnosis not present

## 2018-05-10 DIAGNOSIS — J3089 Other allergic rhinitis: Secondary | ICD-10-CM | POA: Diagnosis not present

## 2018-05-10 DIAGNOSIS — J3081 Allergic rhinitis due to animal (cat) (dog) hair and dander: Secondary | ICD-10-CM | POA: Diagnosis not present

## 2018-05-10 DIAGNOSIS — J301 Allergic rhinitis due to pollen: Secondary | ICD-10-CM | POA: Diagnosis not present

## 2018-05-12 DIAGNOSIS — J3089 Other allergic rhinitis: Secondary | ICD-10-CM | POA: Diagnosis not present

## 2018-05-12 DIAGNOSIS — J301 Allergic rhinitis due to pollen: Secondary | ICD-10-CM | POA: Diagnosis not present

## 2018-05-12 DIAGNOSIS — J3081 Allergic rhinitis due to animal (cat) (dog) hair and dander: Secondary | ICD-10-CM | POA: Diagnosis not present

## 2018-05-17 DIAGNOSIS — M722 Plantar fascial fibromatosis: Secondary | ICD-10-CM | POA: Diagnosis not present

## 2018-05-17 DIAGNOSIS — M21962 Unspecified acquired deformity of left lower leg: Secondary | ICD-10-CM | POA: Diagnosis not present

## 2018-05-19 DIAGNOSIS — J301 Allergic rhinitis due to pollen: Secondary | ICD-10-CM | POA: Diagnosis not present

## 2018-05-19 DIAGNOSIS — J3081 Allergic rhinitis due to animal (cat) (dog) hair and dander: Secondary | ICD-10-CM | POA: Diagnosis not present

## 2018-05-19 DIAGNOSIS — J3089 Other allergic rhinitis: Secondary | ICD-10-CM | POA: Diagnosis not present

## 2018-05-26 DIAGNOSIS — J301 Allergic rhinitis due to pollen: Secondary | ICD-10-CM | POA: Diagnosis not present

## 2018-05-26 DIAGNOSIS — J3081 Allergic rhinitis due to animal (cat) (dog) hair and dander: Secondary | ICD-10-CM | POA: Diagnosis not present

## 2018-05-26 DIAGNOSIS — J3089 Other allergic rhinitis: Secondary | ICD-10-CM | POA: Diagnosis not present

## 2018-06-02 DIAGNOSIS — J3081 Allergic rhinitis due to animal (cat) (dog) hair and dander: Secondary | ICD-10-CM | POA: Diagnosis not present

## 2018-06-02 DIAGNOSIS — J301 Allergic rhinitis due to pollen: Secondary | ICD-10-CM | POA: Diagnosis not present

## 2018-06-02 DIAGNOSIS — J3089 Other allergic rhinitis: Secondary | ICD-10-CM | POA: Diagnosis not present

## 2018-06-07 DIAGNOSIS — J3089 Other allergic rhinitis: Secondary | ICD-10-CM | POA: Diagnosis not present

## 2018-10-31 DIAGNOSIS — M79672 Pain in left foot: Secondary | ICD-10-CM | POA: Diagnosis not present

## 2018-11-03 DIAGNOSIS — M722 Plantar fascial fibromatosis: Secondary | ICD-10-CM | POA: Diagnosis not present

## 2018-11-03 DIAGNOSIS — M79672 Pain in left foot: Secondary | ICD-10-CM | POA: Diagnosis not present

## 2018-11-07 DIAGNOSIS — M79672 Pain in left foot: Secondary | ICD-10-CM | POA: Diagnosis not present

## 2018-11-07 DIAGNOSIS — M722 Plantar fascial fibromatosis: Secondary | ICD-10-CM | POA: Diagnosis not present

## 2018-11-07 DIAGNOSIS — R262 Difficulty in walking, not elsewhere classified: Secondary | ICD-10-CM | POA: Diagnosis not present

## 2018-11-09 DIAGNOSIS — R262 Difficulty in walking, not elsewhere classified: Secondary | ICD-10-CM | POA: Diagnosis not present

## 2018-11-09 DIAGNOSIS — M79672 Pain in left foot: Secondary | ICD-10-CM | POA: Diagnosis not present

## 2018-11-09 DIAGNOSIS — M722 Plantar fascial fibromatosis: Secondary | ICD-10-CM | POA: Diagnosis not present

## 2018-11-14 DIAGNOSIS — M722 Plantar fascial fibromatosis: Secondary | ICD-10-CM | POA: Diagnosis not present

## 2018-11-14 DIAGNOSIS — M79672 Pain in left foot: Secondary | ICD-10-CM | POA: Diagnosis not present

## 2018-11-16 DIAGNOSIS — R69 Illness, unspecified: Secondary | ICD-10-CM | POA: Diagnosis not present

## 2018-11-16 DIAGNOSIS — M722 Plantar fascial fibromatosis: Secondary | ICD-10-CM | POA: Diagnosis not present

## 2018-11-16 DIAGNOSIS — M79672 Pain in left foot: Secondary | ICD-10-CM | POA: Diagnosis not present

## 2018-11-22 DIAGNOSIS — R69 Illness, unspecified: Secondary | ICD-10-CM | POA: Diagnosis not present

## 2018-11-23 DIAGNOSIS — M722 Plantar fascial fibromatosis: Secondary | ICD-10-CM | POA: Diagnosis not present

## 2018-11-23 DIAGNOSIS — M79672 Pain in left foot: Secondary | ICD-10-CM | POA: Diagnosis not present

## 2019-02-07 DIAGNOSIS — Z1389 Encounter for screening for other disorder: Secondary | ICD-10-CM | POA: Diagnosis not present

## 2019-02-07 DIAGNOSIS — Z23 Encounter for immunization: Secondary | ICD-10-CM | POA: Diagnosis not present

## 2019-02-07 DIAGNOSIS — Z0000 Encounter for general adult medical examination without abnormal findings: Secondary | ICD-10-CM | POA: Diagnosis not present

## 2019-02-08 DIAGNOSIS — Z0100 Encounter for examination of eyes and vision without abnormal findings: Secondary | ICD-10-CM | POA: Diagnosis not present

## 2019-04-13 ENCOUNTER — Ambulatory Visit: Payer: Medicare PPO

## 2019-04-21 ENCOUNTER — Ambulatory Visit: Payer: Self-pay

## 2019-04-26 ENCOUNTER — Ambulatory Visit: Payer: Self-pay

## 2019-04-27 ENCOUNTER — Ambulatory Visit: Payer: Medicare HMO | Attending: Internal Medicine

## 2019-04-27 DIAGNOSIS — Z23 Encounter for immunization: Secondary | ICD-10-CM

## 2019-04-27 NOTE — Progress Notes (Signed)
   Covid-19 Vaccination Clinic  Name:  Marcus Hopkins    MRN: [id number] DOB: 29-Mar-1940  04/27/2019  [name] was observed post Covid-19 immunization for 15 minutes without incidence. He was provided with Vaccine Information Sheet and instruction to access the V-Safe system.   [name] was instructed to call 911 with any severe reactions post vaccine: Marland Kitchen Difficulty breathing  . Swelling of your face and throat  . A fast heartbeat  . A bad rash all over your body  . Dizziness and weakness    Immunizations Administered    Name Date Dose VIS Date Route   Pfizer COVID-19 Vaccine 04/27/2019 11:46 AM 0.3 mL 03/03/2019 Intramuscular   Manufacturer: [place]   Lot: [id number]   [place]: [id number]

## 2019-05-22 ENCOUNTER — Ambulatory Visit: Payer: Medicare HMO | Attending: Internal Medicine

## 2019-05-22 DIAGNOSIS — Z23 Encounter for immunization: Secondary | ICD-10-CM | POA: Insufficient documentation

## 2019-05-22 NOTE — Progress Notes (Signed)
   Covid-19 Vaccination Clinic  Name:  PRESS GALESKI    MRN: [id number] DOB: 07/11/1940  05/22/2019  Mr. Lefebure was observed post Covid-19 immunization for 15 minutes without incidence. He was provided with Vaccine Information Sheet and instruction to access the V-Safe system.   Mr. Volkov was instructed to call 911 with any severe reactions post vaccine: Marland Kitchen Difficulty breathing  . Swelling of your face and throat  . A fast heartbeat  . A bad rash all over your body  . Dizziness and weakness    Immunizations Administered    Name Date Dose VIS Date Route   Pfizer COVID-19 Vaccine 05/22/2019  3:33 PM 0.3 mL 03/03/2019 Intramuscular   Manufacturer: [place]   Lot: [id number]   [place]: [id number]

## 2019-06-13 DIAGNOSIS — R062 Wheezing: Secondary | ICD-10-CM | POA: Diagnosis not present

## 2019-06-13 DIAGNOSIS — J3089 Other allergic rhinitis: Secondary | ICD-10-CM | POA: Diagnosis not present

## 2019-06-13 DIAGNOSIS — J301 Allergic rhinitis due to pollen: Secondary | ICD-10-CM | POA: Diagnosis not present

## 2019-06-13 DIAGNOSIS — T781XXD Other adverse food reactions, not elsewhere classified, subsequent encounter: Secondary | ICD-10-CM | POA: Diagnosis not present

## 2019-08-31 DIAGNOSIS — J3089 Other allergic rhinitis: Secondary | ICD-10-CM | POA: Diagnosis not present

## 2019-08-31 DIAGNOSIS — R062 Wheezing: Secondary | ICD-10-CM | POA: Diagnosis not present

## 2019-08-31 DIAGNOSIS — J301 Allergic rhinitis due to pollen: Secondary | ICD-10-CM | POA: Diagnosis not present

## 2019-08-31 DIAGNOSIS — T781XXD Other adverse food reactions, not elsewhere classified, subsequent encounter: Secondary | ICD-10-CM | POA: Diagnosis not present

## 2019-09-07 DIAGNOSIS — R030 Elevated blood-pressure reading, without diagnosis of hypertension: Secondary | ICD-10-CM | POA: Diagnosis not present

## 2019-09-07 DIAGNOSIS — Z8262 Family history of osteoporosis: Secondary | ICD-10-CM | POA: Diagnosis not present

## 2019-09-07 DIAGNOSIS — Z8249 Family history of ischemic heart disease and other diseases of the circulatory system: Secondary | ICD-10-CM | POA: Diagnosis not present

## 2019-09-07 DIAGNOSIS — Z7951 Long term (current) use of inhaled steroids: Secondary | ICD-10-CM | POA: Diagnosis not present

## 2019-09-07 DIAGNOSIS — Z87891 Personal history of nicotine dependence: Secondary | ICD-10-CM | POA: Diagnosis not present

## 2019-09-07 DIAGNOSIS — J45909 Unspecified asthma, uncomplicated: Secondary | ICD-10-CM | POA: Diagnosis not present

## 2019-09-07 DIAGNOSIS — G3184 Mild cognitive impairment, so stated: Secondary | ICD-10-CM | POA: Diagnosis not present

## 2019-11-22 DIAGNOSIS — R69 Illness, unspecified: Secondary | ICD-10-CM | POA: Diagnosis not present

## 2019-11-23 DIAGNOSIS — Z20822 Contact with and (suspected) exposure to covid-19: Secondary | ICD-10-CM | POA: Diagnosis not present

## 2019-12-12 DIAGNOSIS — R69 Illness, unspecified: Secondary | ICD-10-CM | POA: Diagnosis not present

## 2020-01-02 ENCOUNTER — Ambulatory Visit: Payer: Medicare HMO | Attending: Internal Medicine

## 2020-01-02 DIAGNOSIS — Z23 Encounter for immunization: Secondary | ICD-10-CM

## 2020-01-02 NOTE — Progress Notes (Signed)
   Covid-19 Vaccination Clinic  Name:  QUAID YEAKLE    MRN: [id number] DOB: 07/21/1940  01/02/2020  Mr. Vangilder was observed post Covid-19 immunization for 15 minutes without incident. He was provided with Vaccine Information Sheet and instruction to access the V-Safe system.   Mr. Hayward was instructed to call 911 with any severe reactions post vaccine: Marland Kitchen Difficulty breathing  . Swelling of face and throat  . A fast heartbeat  . A bad rash all over body  . Dizziness and weakness

## 2020-01-04 DIAGNOSIS — R0789 Other chest pain: Secondary | ICD-10-CM | POA: Diagnosis not present

## 2020-01-04 DIAGNOSIS — R079 Chest pain, unspecified: Secondary | ICD-10-CM | POA: Diagnosis not present

## 2020-01-05 ENCOUNTER — Telehealth: Payer: Self-pay

## 2020-01-05 NOTE — Telephone Encounter (Signed)
Amy with [facility] returning Lisa's phone call. Connected call to John Day.

## 2020-01-10 ENCOUNTER — Other Ambulatory Visit (HOSPITAL_COMMUNITY): Payer: Self-pay | Admitting: Internal Medicine

## 2020-01-10 DIAGNOSIS — R0789 Other chest pain: Secondary | ICD-10-CM

## 2020-01-10 DIAGNOSIS — R079 Chest pain, unspecified: Secondary | ICD-10-CM

## 2020-01-12 ENCOUNTER — Telehealth (HOSPITAL_COMMUNITY): Payer: Self-pay | Admitting: *Deleted

## 2020-01-12 NOTE — Telephone Encounter (Signed)
Close encounter 

## 2020-01-17 ENCOUNTER — Ambulatory Visit: Payer: Medicare HMO | Admitting: Cardiovascular Disease

## 2020-01-17 ENCOUNTER — Encounter: Payer: Self-pay | Admitting: Cardiovascular Disease

## 2020-01-17 ENCOUNTER — Ambulatory Visit (HOSPITAL_COMMUNITY)
Admission: RE | Admit: 2020-01-17 | Discharge: 2020-01-17 | Disposition: A | Discharge status: Home / Self Care | Payer: Medicare HMO | Source: Ambulatory Visit | Attending: Cardiovascular Disease | Admitting: Cardiovascular Disease

## 2020-01-17 ENCOUNTER — Other Ambulatory Visit: Payer: Self-pay

## 2020-01-17 VITALS — BP 106/68 | HR 59 | Ht 69.0 in | Wt 163.0 lb

## 2020-01-17 DIAGNOSIS — R079 Chest pain, unspecified: Secondary | ICD-10-CM | POA: Diagnosis not present

## 2020-01-17 DIAGNOSIS — I200 Unstable angina: Secondary | ICD-10-CM | POA: Diagnosis not present

## 2020-01-17 DIAGNOSIS — R0789 Other chest pain: Secondary | ICD-10-CM

## 2020-01-17 DIAGNOSIS — R9439 Abnormal result of other cardiovascular function study: Secondary | ICD-10-CM | POA: Diagnosis not present

## 2020-01-17 LAB — MYOCARDIAL PERFUSION IMAGING
LV dias vol: 113 mL (ref 62–150)
LV sys vol: 66 mL
Peak HR: 79 {beats}/min
Rest HR: 50 {beats}/min
SDS: 9
SRS: 8
SSS: 17
TID: 1.23

## 2020-01-17 MED ORDER — REGADENOSON 0.4 MG/5ML IV SOLN
0.4000 mg | Freq: Once | INTRAVENOUS | Status: AC
  Administered 2020-01-17: 0.4 mg via INTRAVENOUS

## 2020-01-17 MED ORDER — ROSUVASTATIN CALCIUM 20 MG PO TABS: 20.0000 mg | ORAL_TABLET | Freq: Every day | ORAL | 3 refills | Status: DC

## 2020-01-17 MED ORDER — TECHNETIUM TC 99M TETROFOSMIN IV KIT
10.2000 | PACK | Freq: Once | INTRAVENOUS | Status: AC | PRN
  Administered 2020-01-17: 10.2 via INTRAVENOUS
  Filled 2020-01-17: qty 11

## 2020-01-17 MED ORDER — NITROGLYCERIN 0.4 MG SL SUBL: 0.4000 mg | SUBLINGUAL_TABLET | SUBLINGUAL | 11 refills | Status: DC | PRN

## 2020-01-17 MED ORDER — TECHNETIUM TC 99M TETROFOSMIN IV KIT
30.9000 | PACK | Freq: Once | INTRAVENOUS | Status: AC | PRN
  Administered 2020-01-17: 30.9 via INTRAVENOUS
  Filled 2020-01-17: qty 31

## 2020-01-17 NOTE — H&P (View-Only) (Signed)
Cardiology Office Note:   Date:  01/17/2020  NAME:  GERELL FORTSON    MRN: [id number] DOB:  05-19-1940   PCP:  Lavone Orn, MD  Cardiologist:  No primary care provider on file.   Referring MD: Lavone Orn, MD   Chief Complaint  Patient presents with   New Patient (Initial Visit)   Chest Pain   History of Present Illness:   Mika DAVIDJAMES BLANSETT is a 79 y.o. male with a hx of allergies who is being seen today for the evaluation of abnormal nuclear medicine stress test at the request of Lavone Orn, MD.  He underwent nuclear medicine stress testing today for chest pain.  He was found to have mild to moderate perfusion defect in the anterior septum and apex that clearly became severe on stress imaging.  This is a severe perfusion defect comprising of 25 to 30% of the entire septum and apical segments.  This is a high rate stress test.  He reports for the last 2 to 3 weeks has had worsening exertional tightness in his chest.  He reports that he is an avid walker walking 2-1/2 to 5 miles per day.  Apparently when he walks at a fast pace he can get tightness in his chest.  The symptoms resolved when he stops.  He reports he can do leisure activities such as yard work without any major limitations.  It apparently does not radiate.  Symptoms resolved pretty quickly.  He does report that the becoming more frequent and coming more strongly.  Again the pain does go away with cessation of activity.  Can last up to 2 minutes.  He reports when it first started he can walk through it.  I suspect this is classic warm up angina.  He had a very high rate stress test.  His most recent lipid profile shows a total cholesterol 201, HDL 60, LDL 124, triglycerides 86.  He is not diabetic.  He is never had a heart attack or stroke.  He is a former smoker and smoked for about 20 years.  He quit a number of years ago.  He does drink alcohol daily.  No allergies reported.  No contrast allergies reported either.  His  father had heart attacks.  Past Medical History: Past Medical History:  Diagnosis Date   Allergic rhinitis    Borderline hyperlipidemia    Cataracts, bilateral    Hemorrhoids    Insomnia     Past Surgical History: Past Surgical History:  Procedure Laterality Date   CHOLECYSTECTOMY     ESOPHAGOGASTRODUODENOSCOPY  11/29/2011   Procedure: ESOPHAGOGASTRODUODENOSCOPY (EGD);  Surgeon: Arta Silence, MD;  Location: [facility];  Service: Endoscopy;  Laterality: N/A;   ROTATOR CUFF REPAIR      Current Medications: Current Meds  Medication Sig   Cholecalciferol (VITAMIN D-3) 25 MCG (1000 UT) CAPS Take 1,000 Units by mouth daily.   Fexofenadine HCl (ALLEGRA PO) Take 1 tablet by mouth daily as needed.   [DISCONTINUED] diclofenac (CATAFLAM) 50 MG tablet Take 1 tablet (50 mg total) by mouth 3 (three) times daily. One tablet TID with food prn pain.     Allergies:    Patient has no known allergies.   Social History: Social History   Socioeconomic History   Marital status: Married    Spouse name: Not on file   Number of children: Not on file   Years of education: Not on file   Highest education level: Not on file  Occupational History  Occupation: Tree surgeon  Tobacco Use   Smoking status: Former Smoker    Years: 30.00    Quit date: 03/23/1993    Years since quitting: 26.8   Smokeless tobacco: Never Used  Substance and Sexual Activity   Alcohol use: Yes    Alcohol/week: 2.0 standard drinks    Types: 2 Glasses of wine per week   Drug use: No   Sexual activity: Not on file  Other Topics Concern   Not on file  Social History Narrative   Not on file   Social Determinants of Health   Financial Resource Strain:    Difficulty of Paying Living Expenses: Not on file  Food Insecurity:    Worried About [place] in the Last Year: Not on file   Ran Out of Food in the Last Year: Not on file  Transportation Needs:    Lack of Transportation  (Medical): Not on file   Lack of Transportation (Non-Medical): Not on file  Physical Activity:    Days of Exercise per Week: Not on file   Minutes of Exercise per Session: Not on file  Stress:    Feeling of Stress : Not on file  Social Connections:    Frequency of Communication with Friends and Family: Not on file   Frequency of Social Gatherings with Friends and Family: Not on file   Attends Religious Services: Not on file   Active Member of Clubs or Organizations: Not on file   Attends Archivist Meetings: Not on file   Marital Status: Not on file     Family History: The patient's family history includes Heart attack in his father.  ROS:   All other ROS reviewed and negative. Pertinent positives noted in the HPI.     EKGs/Labs/Other Studies Reviewed:   The following studies were personally reviewed by me today:  EKG:  EKG is  ordered today.  The ekg ordered today demonstrates sinus bradycardia, heart rate 59, anterolateral T inversions noted, and was personally reviewed by me.   Recent Labs: No results found for requested labs within last 8760 hours.   Recent Lipid Panel No results found for: CHOL, TRIG, HDL, CHOLHDL, VLDL, LDLCALC, LDLDIRECT  Physical Exam:   VS:  BP 106/68 (BP Location: Left Arm, Patient Position: Sitting, Cuff Size: Normal)    Pulse (!) 59    Ht 5\' 9"  (1.753 m)    Wt 163 lb (73.9 kg)    BMI 24.07 kg/m    Wt Readings from Last 3 Encounters:  01/17/20 163 lb (73.9 kg)  01/17/20 165 lb (74.8 kg)  11/29/11 165 lb (74.8 kg)    General: Well nourished, well developed, in no acute distress Heart: Atraumatic, normal size  Eyes: PEERLA, EOMI  Neck: Supple, no JVD Endocrine: No thryomegaly Cardiac: Normal S1, S2; RRR; no murmurs, rubs, or gallops Lungs: Clear to auscultation bilaterally, no wheezing, rhonchi or rales  Abd: Soft, nontender, no hepatomegaly  Ext: No edema, pulses 2+ Musculoskeletal: No deformities, BUE and BLE strength  normal and equal Skin: Warm and dry, no rashes   Neuro: Alert and oriented to person, place, time, and situation, CNII-XII grossly intact, no focal deficits  Psych: Normal mood and affect   ASSESSMENT:   Dasani DERAY DAWES is a 79 y.o. male who presents for the following: 1. Abnormal cardiovascular stress test   2. Unstable angina pectoris ([place])     PLAN:   1. Abnormal cardiovascular stress test 2. Unstable angina  pectoris ([place]) -He presents with classic symptoms of stable angina.  Symptoms are getting worse.  He was having chest tightness that he was able to walk 3.  This is classic warm up angina.  His stress test demonstrates high risk finding including 25 to 30% reversible perfusion defect in the septum into the apex.  Is concerning for LAD ischemia.  Have a high suspicion for severe stenosis in the LAD.  Blood pressure is very low 106/68.  Heart rate in the 50s.  He is highly active.  Risk factors include smoking as well as borderline elevated LDL cholesterol.  I have recommended left heart catheterization given the high risk stress test finding.  I suspect he has a proximal LAD lesion.  I recommended he start aspirin and Crestor 20 mg daily.  His blood pressure will not tolerate metoprolol or amlodipine.  For now we will just do sublingual nitroglycerin as needed.  He was given strict return precautions to the emergency room.  He has no chest pain at rest.  Have asked him to take it easy until he get his heart catheterization done.  We will get it done early next week.  We will also order an echocardiogram.  Disposition: Return in about 2 weeks (around 01/31/2020).  Medication Adjustments/Labs and Tests Ordered: Current medicines are reviewed at length with the patient today.  Concerns regarding medicines are outlined above.  Orders Placed This Encounter  Procedures   Basic metabolic panel   CBC w/Diff/Platelet   PT and PTT   EKG 12-Lead   ECHOCARDIOGRAM COMPLETE   Meds ordered  this encounter  Medications   rosuvastatin (CRESTOR) 20 MG tablet    Sig: Take 1 tablet (20 mg total) by mouth daily.    Dispense:  90 tablet    Refill:  3   nitroGLYCERIN (NITROSTAT) 0.4 MG SL tablet    Sig: Place 1 tablet (0.4 mg total) under the tongue every 5 (five) minutes as needed for chest pain.    Dispense:  25 tablet    Refill:  11    Patient Instructions  Medication Instructions:  Start taking Aspirin 81 mg daily Start Crestor 20 mg daily Start NTG as needed for chest pain Continue all other medications   Lab Work: Bmet,cbc,pt today   Testing/Procedures: Cardiac Cath   Follow instructions below  Schedule Echo   Follow-Up: At [facility], you and your health needs are our priority.  As part of our continuing mission to provide you with exceptional heart care, we have created designated Provider Care Teams.  These Care Teams include your primary Cardiologist (physician) and Advanced Practice Providers (APPs -  Physician Assistants and Nurse Practitioners) who all work together to provide you with the care you need, when you need it.  We recommend signing up for the patient portal called "MyChart".  Sign up information is provided on this After Visit Summary.  MyChart is used to connect with patients for Virtual Visits (Telemedicine).  Patients are able to view lab/test results, encounter notes, upcoming appointments, etc.  Non-urgent messages can be sent to your provider as well.   To learn more about what you can do with MyChart, go to [email or web address].    Your next appointment:  Monday 02/05/20 at 1:40 am   The format for your next appointment: Office    Provider: [place]  [place] [address] Dept: [phone] Loc: [phone]  Kier MCADOO MUZQUIZ  01/17/2020  You are scheduled for a Cardiac Cath on Monday  01/22/20, with Dr.Harding.  1. Please arrive at the [facility] (Main Entrance A) at [facility]: [address] [place], [place] [address] at 8:30 am (This time is two hours before your procedure to ensure your preparation). Free valet parking service is available.   Special note: Every effort is made to have your procedure done on time. Please understand that emergencies sometimes delay scheduled procedures.  2. Diet: Do not eat solid foods after midnight.  The patient may have clear liquids until 5am upon the day of the procedure.  3. Labs: You will need to have blood drawn on today 10/27 You do not need to be fasting.  Covid Test Friday 10/29 at 10:50 am at [address] [employer] until after cath.   4. Medication instructions in preparation for your procedure:        On the morning of your procedure, take your Aspirin 81 mg and any morning medicines NOT listed above.  You may use sips of water.  5. Plan for one night stay--bring personal belongings. 6. Bring a current list of your medications and current insurance cards. 7. You MUST have a responsible person to drive you home. 8. Someone MUST be with you the first 24 hours after you arrive home or your discharge will be delayed. 9. Please wear clothes that are easy to get on and off and wear slip-on shoes.  Thank you for allowing [place] to care for you!   -- [facility]        Signed, Addison Naegeli. Audie Box, [place]  [address], [place], [place] [address] [phone]  01/17/2020 3:32 PM

## 2020-01-17 NOTE — Patient Instructions (Addendum)
Medication Instructions:  Start taking Aspirin 81 mg daily Start Crestor 20 mg daily Start NTG as needed for chest pain Continue all other medications   Lab Work: Bmet,cbc,pt today   Testing/Procedures: Cardiac Cath   Follow instructions below  Schedule Echo   Follow-Up: At [facility], you and your health needs are our priority.  As part of our continuing mission to provide you with exceptional heart care, we have created designated Provider Care Teams.  These Care Teams include your primary Cardiologist (physician) and Advanced Practice Providers (APPs -  Physician Assistants and Nurse Practitioners) who all work together to provide you with the care you need, when you need it.  We recommend signing up for the patient portal called "MyChart".  Sign up information is provided on this After Visit Summary.  MyChart is used to connect with patients for Virtual Visits (Telemedicine).  Patients are able to view lab/test results, encounter notes, upcoming appointments, etc.  Non-urgent messages can be sent to your provider as well.   To learn more about what you can do with MyChart, go to [email or web address].    Your next appointment:  Monday 02/05/20 at 1:40 am   The format for your next appointment: Office    Provider: [place] [address] Dept: [phone] Loc: [place]  01/17/2020  You are scheduled for a Cardiac Cath on Monday 01/22/20, with Dr.Harding.  1. Please arrive at the [facility] (Main Entrance A) at [facility]: [address] [place], [place] [address] at 8:30 am (This time is two hours before your procedure to ensure your preparation). Free valet parking service is available.   Special note: Every effort is made to have your procedure done on time. Please understand that emergencies  sometimes delay scheduled procedures.  2. Diet: Do not eat solid foods after midnight.  The patient may have clear liquids until 5am upon the day of the procedure.  3. Labs: You will need to have blood drawn on today 10/27 You do not need to be fasting.  Covid Test Friday 10/29 at 10:50 am at [address] [employer] until after cath.   4. Medication instructions in preparation for your procedure:        On the morning of your procedure, take your Aspirin 81 mg and any morning medicines NOT listed above.  You may use sips of water.  5. Plan for one night stay--bring personal belongings. 6. Bring a current list of your medications and current insurance cards. 7. You MUST have a responsible person to drive you home. 8. Someone MUST be with you the first 24 hours after you arrive home or your discharge will be delayed. 9. Please wear clothes that are easy to get on and off and wear slip-on shoes.  Thank you for allowing [place] to care for you!   -- [facility]

## 2020-01-17 NOTE — Progress Notes (Signed)
Cardiology Office Note:   Date:  01/17/2020  NAME:  Marcus Hopkins    MRN: [id number] DOB:  1940/11/05   PCP:  Lavone Orn, MD  Cardiologist:  No primary care provider on file.   Referring MD: Lavone Orn, MD   Chief Complaint  Patient presents with   New Patient (Initial Visit)   Chest Pain   History of Present Illness:   Marcus Hopkins is a 79 y.o. male with a hx of allergies who is being seen today for the evaluation of abnormal nuclear medicine stress test at the request of Lavone Orn, MD.  He underwent nuclear medicine stress testing today for chest pain.  He was found to have mild to moderate perfusion defect in the anterior septum and apex that clearly became severe on stress imaging.  This is a severe perfusion defect comprising of 25 to 30% of the entire septum and apical segments.  This is a high rate stress test.  He reports for the last 2 to 3 weeks has had worsening exertional tightness in his chest.  He reports that he is an avid walker walking 2-1/2 to 5 miles per day.  Apparently when he walks at a fast pace he can get tightness in his chest.  The symptoms resolved when he stops.  He reports he can do leisure activities such as yard work without any major limitations.  It apparently does not radiate.  Symptoms resolved pretty quickly.  He does report that the becoming more frequent and coming more strongly.  Again the pain does go away with cessation of activity.  Can last up to 2 minutes.  He reports when it first started he can walk through it.  I suspect this is classic warm up angina.  He had a very high rate stress test.  His most recent lipid profile shows a total cholesterol 201, HDL 60, LDL 124, triglycerides 86.  He is not diabetic.  He is never had a heart attack or stroke.  He is a former smoker and smoked for about 20 years.  He quit a number of years ago.  He does drink alcohol daily.  No allergies reported.  No contrast allergies reported either.  His  father had heart attacks.  Past Medical History: Past Medical History:  Diagnosis Date   Allergic rhinitis    Borderline hyperlipidemia    Cataracts, bilateral    Hemorrhoids    Insomnia     Past Surgical History: Past Surgical History:  Procedure Laterality Date   CHOLECYSTECTOMY     ESOPHAGOGASTRODUODENOSCOPY  11/29/2011   Procedure: ESOPHAGOGASTRODUODENOSCOPY (EGD);  Surgeon: Arta Silence, MD;  Location: [facility];  Service: Endoscopy;  Laterality: N/A;   ROTATOR CUFF REPAIR      Current Medications: Current Meds  Medication Sig   Cholecalciferol (VITAMIN D-3) 25 MCG (1000 UT) CAPS Take 1,000 Units by mouth daily.   Fexofenadine HCl (ALLEGRA PO) Take 1 tablet by mouth daily as needed.   [DISCONTINUED] diclofenac (CATAFLAM) 50 MG tablet Take 1 tablet (50 mg total) by mouth 3 (three) times daily. One tablet TID with food prn pain.     Allergies:    Patient has no known allergies.   Social History: Social History   Socioeconomic History   Marital status: Married    Spouse name: Not on file   Number of children: Not on file   Years of education: Not on file   Highest education level: Not on file  Occupational History  Occupation: Tree surgeon  Tobacco Use   Smoking status: Former Smoker    Years: 30.00    Quit date: 03/23/1993    Years since quitting: 26.8   Smokeless tobacco: Never Used  Substance and Sexual Activity   Alcohol use: Yes    Alcohol/week: 2.0 standard drinks    Types: 2 Glasses of wine per week   Drug use: No   Sexual activity: Not on file  Other Topics Concern   Not on file  Social History Narrative   Not on file   Social Determinants of Health   Financial Resource Strain:    Difficulty of Paying Living Expenses: Not on file  Food Insecurity:    Worried About [place] in the Last Year: Not on file   Ran Out of Food in the Last Year: Not on file  Transportation Needs:    Lack of Transportation  (Medical): Not on file   Lack of Transportation (Non-Medical): Not on file  Physical Activity:    Days of Exercise per Week: Not on file   Minutes of Exercise per Session: Not on file  Stress:    Feeling of Stress : Not on file  Social Connections:    Frequency of Communication with Friends and Family: Not on file   Frequency of Social Gatherings with Friends and Family: Not on file   Attends Religious Services: Not on file   Active Member of Clubs or Organizations: Not on file   Attends Archivist Meetings: Not on file   Marital Status: Not on file     Family History: The patient's family history includes Heart attack in his father.  ROS:   All other ROS reviewed and negative. Pertinent positives noted in the HPI.     EKGs/Labs/Other Studies Reviewed:   The following studies were personally reviewed by me today:  EKG:  EKG is  ordered today.  The ekg ordered today demonstrates sinus bradycardia, heart rate 59, anterolateral T inversions noted, and was personally reviewed by me.   Recent Labs: No results found for requested labs within last 8760 hours.   Recent Lipid Panel No results found for: CHOL, TRIG, HDL, CHOLHDL, VLDL, LDLCALC, LDLDIRECT  Physical Exam:   VS:  BP 106/68 (BP Location: Left Arm, Patient Position: Sitting, Cuff Size: Normal)    Pulse (!) 59    Ht 5\' 9"  (1.753 m)    Wt 163 lb (73.9 kg)    BMI 24.07 kg/m    Wt Readings from Last 3 Encounters:  01/17/20 163 lb (73.9 kg)  01/17/20 165 lb (74.8 kg)  11/29/11 165 lb (74.8 kg)    General: Well nourished, well developed, in no acute distress Heart: Atraumatic, normal size  Eyes: PEERLA, EOMI  Neck: Supple, no JVD Endocrine: No thryomegaly Cardiac: Normal S1, S2; RRR; no murmurs, rubs, or gallops Lungs: Clear to auscultation bilaterally, no wheezing, rhonchi or rales  Abd: Soft, nontender, no hepatomegaly  Ext: No edema, pulses 2+ Musculoskeletal: No deformities, BUE and BLE strength  normal and equal Skin: Warm and dry, no rashes   Neuro: Alert and oriented to person, place, time, and situation, CNII-XII grossly intact, no focal deficits  Psych: Normal mood and affect   ASSESSMENT:   Marcus Hopkins is a 79 y.o. male who presents for the following: 1. Abnormal cardiovascular stress test   2. Unstable angina pectoris ([place])     PLAN:   1. Abnormal cardiovascular stress test 2. Unstable angina  pectoris ([place]) -He presents with classic symptoms of stable angina.  Symptoms are getting worse.  He was having chest tightness that he was able to walk 3.  This is classic warm up angina.  His stress test demonstrates high risk finding including 25 to 30% reversible perfusion defect in the septum into the apex.  Is concerning for LAD ischemia.  Have a high suspicion for severe stenosis in the LAD.  Blood pressure is very low 106/68.  Heart rate in the 50s.  He is highly active.  Risk factors include smoking as well as borderline elevated LDL cholesterol.  I have recommended left heart catheterization given the high risk stress test finding.  I suspect he has a proximal LAD lesion.  I recommended he start aspirin and Crestor 20 mg daily.  His blood pressure will not tolerate metoprolol or amlodipine.  For now we will just do sublingual nitroglycerin as needed.  He was given strict return precautions to the emergency room.  He has no chest pain at rest.  Have asked him to take it easy until he get his heart catheterization done.  We will get it done early next week.  We will also order an echocardiogram.  Disposition: Return in about 2 weeks (around 01/31/2020).  Medication Adjustments/Labs and Tests Ordered: Current medicines are reviewed at length with the patient today.  Concerns regarding medicines are outlined above.  Orders Placed This Encounter  Procedures   Basic metabolic panel   CBC w/Diff/Platelet   PT and PTT   EKG 12-Lead   ECHOCARDIOGRAM COMPLETE   Meds ordered  this encounter  Medications   rosuvastatin (CRESTOR) 20 MG tablet    Sig: Take 1 tablet (20 mg total) by mouth daily.    Dispense:  90 tablet    Refill:  3   nitroGLYCERIN (NITROSTAT) 0.4 MG SL tablet    Sig: Place 1 tablet (0.4 mg total) under the tongue every 5 (five) minutes as needed for chest pain.    Dispense:  25 tablet    Refill:  11    Patient Instructions  Medication Instructions:  Start taking Aspirin 81 mg daily Start Crestor 20 mg daily Start NTG as needed for chest pain Continue all other medications   Lab Work: Bmet,cbc,pt today   Testing/Procedures: Cardiac Cath   Follow instructions below  Schedule Echo   Follow-Up: At [facility], you and your health needs are our priority.  As part of our continuing mission to provide you with exceptional heart care, we have created designated Provider Care Teams.  These Care Teams include your primary Cardiologist (physician) and Advanced Practice Providers (APPs -  Physician Assistants and Nurse Practitioners) who all work together to provide you with the care you need, when you need it.  We recommend signing up for the patient portal called "MyChart".  Sign up information is provided on this After Visit Summary.  MyChart is used to connect with patients for Virtual Visits (Telemedicine).  Patients are able to view lab/test results, encounter notes, upcoming appointments, etc.  Non-urgent messages can be sent to your provider as well.   To learn more about what you can do with MyChart, go to [email or web address].    Your next appointment:  Monday 02/05/20 at 1:40 am   The format for your next appointment: Office    Provider: [place]  [place] [address] Dept: [phone] Loc: [phone]  [place] [facility]  01/17/2020  You are scheduled for a Cardiac Cath on Monday  01/22/20, with Dr.Harding.  1. Please arrive at the [facility] (Main Entrance A) at [facility]: [address] [place], [place] [address] at 8:30 am (This time is two hours before your procedure to ensure your preparation). Free valet parking service is available.   Special note: Every effort is made to have your procedure done on time. Please understand that emergencies sometimes delay scheduled procedures.  2. Diet: Do not eat solid foods after midnight.  The patient may have clear liquids until 5am upon the day of the procedure.  3. Labs: You will need to have blood drawn on today 10/27 You do not need to be fasting.  Covid Test Friday 10/29 at 10:50 am at [address] [employer] until after cath.   4. Medication instructions in preparation for your procedure:        On the morning of your procedure, take your Aspirin 81 mg and any morning medicines NOT listed above.  You may use sips of water.  5. Plan for one night stay--bring personal belongings. 6. Bring a current list of your medications and current insurance cards. 7. You MUST have a responsible person to drive you home. 8. Someone MUST be with you the first 24 hours after you arrive home or your discharge will be delayed. 9. Please wear clothes that are easy to get on and off and wear slip-on shoes.  Thank you for allowing [place] to care for you!   -- [facility]        Signed, Addison Naegeli. Audie Box, [place]  [address] [place], [place] [address] [phone]  01/17/2020 3:32 PM

## 2020-01-18 ENCOUNTER — Telehealth: Payer: Self-pay | Admitting: *Deleted

## 2020-01-18 LAB — BASIC METABOLIC PANEL
BUN/Creatinine Ratio: 14 (ref 10–24)
BUN: 15 mg/dL (ref 8–27)
CO2: 23 mmol/L (ref 20–29)
Calcium: 9.1 mg/dL (ref 8.6–10.2)
Chloride: 101 mmol/L (ref 96–106)
Creatinine, Ser: 1.1 mg/dL (ref 0.76–1.27)
GFR calc Af Amer: 74 mL/min/{1.73_m2} (ref >59)
GFR calc non Af Amer: 64 mL/min/{1.73_m2} (ref >59)
Glucose: 97 mg/dL (ref 65–99)
Potassium: 4.2 mmol/L (ref 3.5–5.2)
Sodium: 138 mmol/L (ref 134–144)

## 2020-01-18 LAB — CBC WITH DIFFERENTIAL/PLATELET
Basophils Absolute: 0.1 10*3/uL (ref 0.0–0.2)
Basos: 1 %
EOS (ABSOLUTE): 0.2 10*3/uL (ref 0.0–0.4)
Eos: 3 %
Hematocrit: 47.9 % (ref 37.5–51.0)
Hemoglobin: 16.6 g/dL (ref 13.0–17.7)
Immature Grans (Abs): 0 10*3/uL (ref 0.0–0.1)
Immature Granulocytes: 0 %
Lymphocytes Absolute: 1.6 10*3/uL (ref 0.7–3.1)
Lymphs: 22 %
MCH: 33.3 pg — ABNORMAL HIGH (ref 26.6–33.0)
MCHC: 34.7 g/dL (ref 31.5–35.7)
MCV: 96 fL (ref 79–97)
Monocytes Absolute: 0.6 10*3/uL (ref 0.1–0.9)
Monocytes: 9 %
Neutrophils Absolute: 4.7 10*3/uL (ref 1.4–7.0)
Neutrophils: 65 %
Platelets: 311 10*3/uL (ref 150–450)
RBC: 4.98 x10E6/uL (ref 4.14–5.80)
RDW: 12.7 % (ref 11.6–15.4)
WBC: 7.2 10*3/uL (ref 3.4–10.8)

## 2020-01-18 LAB — PT AND PTT
INR: 1 (ref 0.9–1.2)
Prothrombin Time: 10.9 s (ref 9.1–12.0)
aPTT: 27 s (ref 24–33)

## 2020-01-18 NOTE — Telephone Encounter (Signed)
Pt contacted pre-catheterization scheduled at [facility] for: Monday January 22, 2020 10:30 AM Verified arrival time and place: [facility]) at: 8:30 AM   No solid food after midnight prior to cath, clear liquids until 5 AM day of procedure.   AM meds can be  taken pre-cath with sips of water including: ASA 81 mg   Confirmed patient has responsible adult to drive home post procedure and be with patient first 24 hours after arriving home: yes  You are allowed ONE visitor in the waiting room during the time you are at the hospital for your procedure. Both you and your visitor must wear a mask once you enter the hospital.       COVID-19 Pre-Screening Questions:   In the past 14 days have you had a new cough, new headache, new nasal congestion, fever (100.4 or greater) unexplained body aches, new sore throat, or sudden loss of taste or sense of smell? no  In the past 14 days have you been around anyone with known Covid 19? no   Reviewed procedure/mask/visitor instructions, COVID-19 questions reviewed with patient.

## 2020-01-19 ENCOUNTER — Other Ambulatory Visit (HOSPITAL_COMMUNITY)
Admission: RE | Admit: 2020-01-19 | Discharge: 2020-01-19 | Disposition: A | Discharge status: Home / Self Care | Payer: Medicare HMO | Source: Ambulatory Visit | Attending: Cardiology | Admitting: Cardiology

## 2020-01-19 DIAGNOSIS — Z01812 Encounter for preprocedural laboratory examination: Secondary | ICD-10-CM | POA: Insufficient documentation

## 2020-01-19 DIAGNOSIS — Z20822 Contact with and (suspected) exposure to covid-19: Secondary | ICD-10-CM | POA: Insufficient documentation

## 2020-01-20 LAB — SARS CORONAVIRUS 2 (TAT 6-24 HRS): SARS Coronavirus 2: NEGATIVE

## 2020-01-22 ENCOUNTER — Encounter (HOSPITAL_COMMUNITY): Payer: Self-pay | Admitting: Cardiology

## 2020-01-22 ENCOUNTER — Encounter (HOSPITAL_COMMUNITY)
Admission: RE | Disposition: A | Discharge status: Home / Self Care | Payer: Self-pay | Source: Home / Self Care | Attending: Cardiology

## 2020-01-22 ENCOUNTER — Other Ambulatory Visit: Payer: Self-pay

## 2020-01-22 ENCOUNTER — Ambulatory Visit (HOSPITAL_COMMUNITY)
Admission: RE | Admit: 2020-01-22 | Discharge: 2020-01-23 | Disposition: A | Discharge status: Home / Self Care | Payer: Medicare HMO | Attending: Cardiology | Admitting: Cardiology

## 2020-01-22 DIAGNOSIS — Z8249 Family history of ischemic heart disease and other diseases of the circulatory system: Secondary | ICD-10-CM | POA: Diagnosis not present

## 2020-01-22 DIAGNOSIS — Z955 Presence of coronary angioplasty implant and graft: Secondary | ICD-10-CM | POA: Diagnosis not present

## 2020-01-22 DIAGNOSIS — Z87891 Personal history of nicotine dependence: Secondary | ICD-10-CM | POA: Diagnosis not present

## 2020-01-22 DIAGNOSIS — I25110 Atherosclerotic heart disease of native coronary artery with unstable angina pectoris: Secondary | ICD-10-CM

## 2020-01-22 DIAGNOSIS — I200 Unstable angina: Secondary | ICD-10-CM

## 2020-01-22 DIAGNOSIS — I255 Ischemic cardiomyopathy: Secondary | ICD-10-CM | POA: Diagnosis not present

## 2020-01-22 DIAGNOSIS — Z9861 Coronary angioplasty status: Secondary | ICD-10-CM

## 2020-01-22 DIAGNOSIS — I2582 Chronic total occlusion of coronary artery: Secondary | ICD-10-CM | POA: Diagnosis not present

## 2020-01-22 DIAGNOSIS — I25119 Atherosclerotic heart disease of native coronary artery with unspecified angina pectoris: Secondary | ICD-10-CM | POA: Diagnosis not present

## 2020-01-22 DIAGNOSIS — I2510 Atherosclerotic heart disease of native coronary artery without angina pectoris: Secondary | ICD-10-CM

## 2020-01-22 DIAGNOSIS — R9439 Abnormal result of other cardiovascular function study: Secondary | ICD-10-CM

## 2020-01-22 HISTORY — PX: CORONARY CTO INTERVENTION: CATH118236

## 2020-01-22 HISTORY — PX: LEFT HEART CATH AND CORONARY ANGIOGRAPHY: CATH118249

## 2020-01-22 LAB — POCT ACTIVATED CLOTTING TIME
Activated Clotting Time: 235 seconds
Activated Clotting Time: 268 seconds
Activated Clotting Time: 307 seconds
Activated Clotting Time: 312 seconds

## 2020-01-22 SURGERY — LEFT HEART CATH AND CORONARY ANGIOGRAPHY
Anesthesia: LOCAL

## 2020-01-22 MED ORDER — SODIUM CHLORIDE 0.9 % WEIGHT BASED INFUSION
1.0000 mL/kg/h | INTRAVENOUS | Status: AC
  Administered 2020-01-22: 1 mL/kg/h via INTRAVENOUS

## 2020-01-22 MED ORDER — PANTOPRAZOLE SODIUM 40 MG PO TBEC: 40.0000 mg | DELAYED_RELEASE_TABLET | Freq: Every day | ORAL | Status: DC

## 2020-01-22 MED ORDER — HEPARIN SODIUM (PORCINE) 1000 UNIT/ML IJ SOLN
INTRAMUSCULAR | Status: AC
  Filled 2020-01-22: qty 1

## 2020-01-22 MED ORDER — SODIUM CHLORIDE 0.9 % IV SOLN: 250.0000 mL | INTRAVENOUS | Status: DC | PRN

## 2020-01-22 MED ORDER — ROSUVASTATIN CALCIUM 20 MG PO TABS
20.0000 mg | ORAL_TABLET | Freq: Every day | ORAL | Status: DC
  Administered 2020-01-23: 20 mg via ORAL
  Filled 2020-01-22: qty 1

## 2020-01-22 MED ORDER — MOMETASONE FURO-FORMOTEROL FUM 100-5 MCG/ACT IN AERO: 2.0000 | INHALATION_SPRAY | Freq: Two times a day (BID) | RESPIRATORY_TRACT | Status: DC

## 2020-01-22 MED ORDER — ACETAMINOPHEN 500 MG PO TABS: 1000.0000 mg | ORAL_TABLET | Freq: Four times a day (QID) | ORAL | Status: DC | PRN

## 2020-01-22 MED ORDER — MIDAZOLAM HCL 2 MG/2ML IJ SOLN
INTRAMUSCULAR | Status: AC
  Filled 2020-01-22: qty 2

## 2020-01-22 MED ORDER — MIDAZOLAM HCL 2 MG/2ML IJ SOLN
INTRAMUSCULAR | Status: DC | PRN
  Administered 2020-01-22: 2 mg via INTRAVENOUS

## 2020-01-22 MED ORDER — SODIUM CHLORIDE 0.9 % IV SOLN
INTRAVENOUS | Status: AC | PRN
  Administered 2020-01-22: 500 mL via INTRAVENOUS

## 2020-01-22 MED ORDER — NITROGLYCERIN 1 MG/10 ML FOR IR/CATH LAB
INTRA_ARTERIAL | Status: AC
  Filled 2020-01-22: qty 10

## 2020-01-22 MED ORDER — ASPIRIN 81 MG PO CHEW: 81.0000 mg | CHEWABLE_TABLET | ORAL | Status: DC

## 2020-01-22 MED ORDER — LIDOCAINE HCL (PF) 1 % IJ SOLN
INTRAMUSCULAR | Status: AC
  Filled 2020-01-22: qty 30

## 2020-01-22 MED ORDER — SODIUM CHLORIDE 0.9 % WEIGHT BASED INFUSION
3.0000 mL/kg/h | INTRAVENOUS | Status: DC
  Administered 2020-01-22: 3 mL/kg/h via INTRAVENOUS

## 2020-01-22 MED ORDER — CLOPIDOGREL BISULFATE 300 MG PO TABS
ORAL_TABLET | ORAL | Status: DC | PRN
  Administered 2020-01-22: 600 mg via ORAL

## 2020-01-22 MED ORDER — SODIUM CHLORIDE 0.9% FLUSH: 3.0000 mL | Freq: Two times a day (BID) | INTRAVENOUS | Status: DC

## 2020-01-22 MED ORDER — FAMOTIDINE IN NACL 20-0.9 MG/50ML-% IV SOLN
INTRAVENOUS | Status: AC | PRN
  Administered 2020-01-22: 20 mg via INTRAVENOUS

## 2020-01-22 MED ORDER — SODIUM CHLORIDE 0.9% FLUSH: 3.0000 mL | INTRAVENOUS | Status: DC | PRN

## 2020-01-22 MED ORDER — FENTANYL CITRATE (PF) 100 MCG/2ML IJ SOLN
INTRAMUSCULAR | Status: AC
  Filled 2020-01-22: qty 2

## 2020-01-22 MED ORDER — SODIUM CHLORIDE 0.9 % WEIGHT BASED INFUSION: 1.0000 mL/kg/h | INTRAVENOUS | Status: DC

## 2020-01-22 MED ORDER — LIDOCAINE HCL (PF) 1 % IJ SOLN
INTRAMUSCULAR | Status: DC | PRN
  Administered 2020-01-22: 2 mL via INTRADERMAL

## 2020-01-22 MED ORDER — ONDANSETRON HCL 4 MG/2ML IJ SOLN: 4.0000 mg | Freq: Four times a day (QID) | INTRAMUSCULAR | Status: DC | PRN

## 2020-01-22 MED ORDER — NOREPINEPHRINE BITARTRATE 1 MG/ML IV SOLN
INTRAVENOUS | Status: DC | PRN
  Administered 2020-01-22: 20 ug/min via INTRAVENOUS

## 2020-01-22 MED ORDER — NITROGLYCERIN 1 MG/10 ML FOR IR/CATH LAB
INTRA_ARTERIAL | Status: DC | PRN
  Administered 2020-01-22 (×2): 200 ug via INTRACORONARY

## 2020-01-22 MED ORDER — HEPARIN SODIUM (PORCINE) 1000 UNIT/ML IJ SOLN
INTRAMUSCULAR | Status: DC | PRN
  Administered 2020-01-22: 3000 [IU] via INTRAVENOUS
  Administered 2020-01-22: 4000 [IU] via INTRAVENOUS
  Administered 2020-01-22: 2000 [IU] via INTRAVENOUS
  Administered 2020-01-22: 4000 [IU] via INTRAVENOUS

## 2020-01-22 MED ORDER — HEPARIN (PORCINE) IN NACL 1000-0.9 UT/500ML-% IV SOLN
INTRAVENOUS | Status: AC
  Filled 2020-01-22: qty 1000

## 2020-01-22 MED ORDER — CLOPIDOGREL BISULFATE 300 MG PO TABS
ORAL_TABLET | ORAL | Status: AC
  Filled 2020-01-22: qty 2

## 2020-01-22 MED ORDER — ALBUTEROL SULFATE HFA 108 (90 BASE) MCG/ACT IN AERS: 2.0000 | INHALATION_SPRAY | Freq: Four times a day (QID) | RESPIRATORY_TRACT | Status: DC | PRN

## 2020-01-22 MED ORDER — CLOPIDOGREL BISULFATE 75 MG PO TABS
75.0000 mg | ORAL_TABLET | Freq: Every day | ORAL | Status: DC
  Administered 2020-01-23: 75 mg via ORAL
  Filled 2020-01-22: qty 1

## 2020-01-22 MED ORDER — VERAPAMIL HCL 2.5 MG/ML IV SOLN
INTRAVENOUS | Status: DC | PRN
  Administered 2020-01-22: 10 mL via INTRA_ARTERIAL

## 2020-01-22 MED ORDER — FENTANYL CITRATE (PF) 100 MCG/2ML IJ SOLN
INTRAMUSCULAR | Status: DC | PRN
  Administered 2020-01-22 (×2): 25 ug via INTRAVENOUS

## 2020-01-22 MED ORDER — ASPIRIN EC 81 MG PO TBEC
81.0000 mg | DELAYED_RELEASE_TABLET | Freq: Every day | ORAL | Status: DC
  Administered 2020-01-23: 81 mg via ORAL
  Filled 2020-01-22: qty 1

## 2020-01-22 MED ORDER — DIPHENHYDRAMINE HCL 25 MG PO CAPS: 25.0000 mg | ORAL_CAPSULE | Freq: Every day | ORAL | Status: DC | PRN

## 2020-01-22 MED ORDER — HEPARIN (PORCINE) IN NACL 1000-0.9 UT/500ML-% IV SOLN
INTRAVENOUS | Status: DC | PRN
  Administered 2020-01-22 (×2): 500 mL

## 2020-01-22 MED ORDER — IOHEXOL 350 MG/ML SOLN
INTRAVENOUS | Status: DC | PRN
  Administered 2020-01-22: 260 mL

## 2020-01-22 MED ORDER — VITAMIN D3 25 MCG (1000 UNIT) PO TABS
1000.0000 [IU] | ORAL_TABLET | Freq: Every day | ORAL | Status: DC
  Administered 2020-01-23: 1000 [IU] via ORAL
  Filled 2020-01-22 (×2): qty 1

## 2020-01-22 MED ORDER — SODIUM CHLORIDE 0.9% FLUSH
3.0000 mL | Freq: Two times a day (BID) | INTRAVENOUS | Status: DC
  Administered 2020-01-22 – 2020-01-23 (×2): 3 mL via INTRAVENOUS

## 2020-01-22 MED ORDER — LABETALOL HCL 5 MG/ML IV SOLN: 10.0000 mg | INTRAVENOUS | Status: AC | PRN

## 2020-01-22 MED ORDER — VERAPAMIL HCL 2.5 MG/ML IV SOLN
INTRAVENOUS | Status: AC
  Filled 2020-01-22: qty 2

## 2020-01-22 MED ORDER — ZOLPIDEM TARTRATE 5 MG PO TABS
5.0000 mg | ORAL_TABLET | Freq: Every evening | ORAL | Status: DC | PRN
  Filled 2020-01-22: qty 1

## 2020-01-22 MED ORDER — ACETAMINOPHEN 325 MG PO TABS: 650.0000 mg | ORAL_TABLET | ORAL | Status: DC | PRN

## 2020-01-22 MED ORDER — HYDRALAZINE HCL 20 MG/ML IJ SOLN: 10.0000 mg | INTRAMUSCULAR | Status: AC | PRN

## 2020-01-22 MED ORDER — NITROGLYCERIN 0.4 MG SL SUBL: 0.4000 mg | SUBLINGUAL_TABLET | SUBLINGUAL | Status: DC | PRN

## 2020-01-22 SURGICAL SUPPLY — 23 items
BALLN SAPPHIRE 2.0X12 (BALLOONS) ×2
BALLN SAPPHIRE 2.5X12 (BALLOONS) ×2
BALLN SAPPHIRE ~~LOC~~ 2.5X18 (BALLOONS) ×1 IMPLANT
BALLN SAPPHIRE ~~LOC~~ 3.0X18 (BALLOONS) ×1 IMPLANT
BALLN SAPPHIRE ~~LOC~~ 3.25X12 (BALLOONS) ×1 IMPLANT
BALLOON SAPPHIRE 2.0X12 (BALLOONS) IMPLANT
BALLOON SAPPHIRE 2.5X12 (BALLOONS) IMPLANT
CATH OPTITORQUE TIG 4.0 5F (CATHETERS) ×1 IMPLANT
CATH VISTA GUIDE 6FR XBLAD3.5 (CATHETERS) ×1 IMPLANT
GLIDESHEATH SLEND SS 6F .021 (SHEATH) ×1 IMPLANT
GUIDEWIRE INQWIRE 1.5J.035X260 (WIRE) IMPLANT
INQWIRE 1.5J .035X260CM (WIRE) ×2
KIT ENCORE 26 ADVANTAGE (KITS) ×1 IMPLANT
KIT ESSENTIALS PG (KITS) ×1 IMPLANT
KIT HEART LEFT (KITS) ×2 IMPLANT
PACK CARDIAC CATHETERIZATION (CUSTOM PROCEDURE TRAY) ×2 IMPLANT
SHEATH PROBE COVER 6X72 (BAG) ×1 IMPLANT
STENT RESOLUTE ONYX 2.5X38 (Permanent Stent) ×1 IMPLANT
STENT RESOLUTE ONYX 3.0X12 (Permanent Stent) ×1 IMPLANT
TRANSDUCER W/STOPCOCK (MISCELLANEOUS) ×2 IMPLANT
TUBING CIL FLEX 10 FLL-RA (TUBING) ×2 IMPLANT
WIRE HI TORQ VERSACORE-J 145CM (WIRE) ×1 IMPLANT
WIRE PT2 MS 185 (WIRE) ×1 IMPLANT

## 2020-01-22 NOTE — Interval H&P Note (Signed)
History and Physical Interval Note:  01/22/2020 9:58 AM  Marcus Hopkins  has presented today for surgery, with the diagnosis of angina and abnormal myoview.  The various methods of treatment have been discussed with the patient and family. After consideration of risks, benefits and other options for treatment, the patient has consented to  Procedure(s): LEFT HEART CATH AND CORONARY ANGIOGRAPHY (N/A)  PERCUTANEOUS CORONARY INTERVENTION  as a surgical intervention.  The patient's history has been reviewed, patient examined, no change in status, stable for surgery.  I have reviewed the patient's chart and labs.  Questions were answered to the patient's satisfaction.   .  Cath Lab Visit (complete for each Cath Lab visit)  Clinical Evaluation Leading to the Procedure:   ACS: No.  Non-ACS:    Anginal Classification: CCS III  Anti-ischemic medical therapy: Minimal Therapy (1 class of medications)  Non-Invasive Test Results: High-risk stress test findings: cardiac mortality >3%/year  Prior CABG: No previous CABG   Glenetta Hew

## 2020-01-23 ENCOUNTER — Other Ambulatory Visit (HOSPITAL_COMMUNITY): Payer: Self-pay | Admitting: Cardiology

## 2020-01-23 DIAGNOSIS — I255 Ischemic cardiomyopathy: Secondary | ICD-10-CM | POA: Diagnosis not present

## 2020-01-23 DIAGNOSIS — Z955 Presence of coronary angioplasty implant and graft: Secondary | ICD-10-CM | POA: Diagnosis not present

## 2020-01-23 DIAGNOSIS — I200 Unstable angina: Secondary | ICD-10-CM | POA: Diagnosis not present

## 2020-01-23 DIAGNOSIS — Z8249 Family history of ischemic heart disease and other diseases of the circulatory system: Secondary | ICD-10-CM | POA: Diagnosis not present

## 2020-01-23 DIAGNOSIS — I25119 Atherosclerotic heart disease of native coronary artery with unspecified angina pectoris: Secondary | ICD-10-CM | POA: Diagnosis not present

## 2020-01-23 DIAGNOSIS — Z87891 Personal history of nicotine dependence: Secondary | ICD-10-CM | POA: Diagnosis not present

## 2020-01-23 LAB — CBC
HCT: 45 % (ref 39.0–52.0)
Hemoglobin: 15.1 g/dL (ref 13.0–17.0)
MCH: 32.1 pg (ref 26.0–34.0)
MCHC: 33.6 g/dL (ref 30.0–36.0)
MCV: 95.7 fL (ref 80.0–100.0)
Platelets: 261 10*3/uL (ref 150–400)
RBC: 4.7 MIL/uL (ref 4.22–5.81)
RDW: 12.7 % (ref 11.5–15.5)
WBC: 8.2 10*3/uL (ref 4.0–10.5)
nRBC: 0 % (ref 0.0–0.2)

## 2020-01-23 LAB — BASIC METABOLIC PANEL
Anion gap: 11 (ref 5–15)
BUN: 11 mg/dL (ref 8–23)
CO2: 23 mmol/L (ref 22–32)
Calcium: 8.9 mg/dL (ref 8.9–10.3)
Chloride: 104 mmol/L (ref 98–111)
Creatinine, Ser: 1.15 mg/dL (ref 0.61–1.24)
GFR, Estimated: >60 mL/min (ref >60)
Glucose, Bld: 101 mg/dL — ABNORMAL HIGH (ref 70–99)
Potassium: 3.7 mmol/L (ref 3.5–5.1)
Sodium: 138 mmol/L (ref 135–145)

## 2020-01-23 MED ORDER — PANTOPRAZOLE SODIUM 40 MG PO TBEC: 40.0000 mg | DELAYED_RELEASE_TABLET | Freq: Every day | ORAL | 1 refills | Status: DC

## 2020-01-23 MED ORDER — CLOPIDOGREL BISULFATE 75 MG PO TABS
75.0000 mg | ORAL_TABLET | Freq: Every day | ORAL | 2 refills | Status: DC
Start: 2020-01-23 — End: 2020-02-05

## 2020-01-23 MED FILL — CLOPIDOGREL 75 MG TABLET: 75 | 90 days supply | Qty: 90 | Fill #0

## 2020-01-23 MED FILL — PANTOPRAZOLE SOD DR 40 MG T: 40 | 30 days supply | Qty: 30 | Fill #0

## 2020-01-23 NOTE — Discharge Summary (Signed)
Discharge Summary    Patient ID: Marcus Hopkins MRN: [id number]; DOB: 03/31/1940  Admit date: 01/22/2020 Discharge date: 01/23/2020  Primary Care Provider: Lavone Orn, MD  Primary Cardiologist: Evalina Field, MD  Primary Electrophysiologist:  None   Discharge Diagnoses    Principal Problem:   Unstable angina [facility]) Active Problems:   CAD S/P percutaneous coronary angioplasty   Abnormal nuclear stress test   Coronary artery disease involving native coronary artery of native heart with unstable angina pectoris [facility])   Ischemic cardiomyopathy    Diagnostic Studies/Procedures    Cath: 01/22/20   There is mild to moderate left ventricular systolic dysfunction. The left ventricular ejection fraction is 35-45% by visual estimate. -> LAD wall motion abnormality  LV end diastolic pressure is normal.  -------------------------------------------  CULPRIT LESION: Mid LAD to Dist LAD lesion is 100% stenosed.  A drug-eluting stent was successfully placed covering the distal portion of the occlusion, using a STENT RESOLUTE ONYX 2.5X38. -> Postdilated tapered manner from 3.1 to 2.7 mm  A second proximal overlapping drug-eluting stent was successfully placed using a STENT RESOLUTE ONYX 3.0X12. -> Postdilated to 3.4 mm  Post intervention, there is a 0% residual stenosis.  4th Diag lesion is 50% stenosed. Dist LAD lesion is 75% stenosed.  Prox RCA-1 lesion is 25% stenosed. Prox RCA-2 lesion is 25% stenosed.   SUMMARY  Severe single-vessel disease with 100% CTO of the LAD just after 2 diagonal branches (1st & 2nd diag) and 1st Sep branch; otherwise no significant CAD.  Successful CTO PCI of LAD with antegrade wiring.  Somewhat difficult procedure with difficult wiring, stepwise balloon angioplasty and 2 overlapping stents required.  Brief interval of hypotension just prior to stent deployment, responded to short term IV Levophed + 250 mL NS bolus.   RECOMMENDATION  Due to  brief episode of hypotension and TIMI II flow apical flow in the LAD, we will monitor him overnight.  DAPT uninterrupted 6 months, but would continue Plavix lifelong due to long extensive LAD stent.  Initiate RF modification: Lipid management, beta-blocker, etc.  Glenetta Hew, M.D., M.S. Interventional Cardiologist   Diagnostic Dominance: Right  Intervention    _____________   History of Present Illness     Marcus Hopkins is a 79 y.o. male with a hx of allergies who was seen by Dr. Audie Box for the evaluation of abnormal nuclear medicine stress test at the request of Lavone Orn, MD.  He underwent nuclear medicine stress testing for chest pain.  He was found to have mild to moderate perfusion defect in the anterior septum and apex that clearly became severe on stress imaging.  This was a severe perfusion defect comprising of 25 to 30% of the entire septum and apical segments.  Notable high risk result.   He reported for the last 2 to 3 weeks had worsening exertional tightness in his chest.  He reported that he was an avid walker walking 2-1/2 to 5 miles per day.  Apparently when he walks at a fast pace he can get tightness in his chest.  The symptoms resolved when he would stop.  He reported he could do leisure activities such as yard work without any major limitations.  It apparently did not radiate.  Symptoms resolved pretty quickly.  He did report that the episodes had been becoming more frequent and coming more strongly.  Again the pain did go away with cessation of activity.  Could last up to 2 minutes.  He reported when  it first started he can walk through it. He had a very high rate stress test.  His most recent lipid profile shows a total cholesterol 201, HDL 60, LDL 124, triglycerides 86.  He is not diabetic.  He is never had a heart attack or stroke.  He is a former smoker and smoked for about 20 years.  He quit a number of years ago.  He does drink alcohol daily.  No allergies  reported.  No contrast allergies reported either.  His father had heart attacks. Given this finding he was set up for outpatient cardiac cath.   Hospital Course     Underwent cardiac cath noted above with single CAD with 100% CTO of LAD just beyond 2 diagonal branches. Successful PCI/DESx2 with overlapping stents. Developed brief episode of hypotension just prior to stent deployment that required levophed. This resolved and he was able to be weaned quickly. Placed on DAPT with ASA/Plavix for at least 6 months, likely longer with extensive LAD stenting per Dr. Ellyn Hack. EF noted at 35-45% on LV gram. No complications noted overnight. Blood pressures remained soft, therefore did not start BB prior to discharge. Will need to consider at outpatient follow up. Does have outpatient echo ordered on 11/12 with office follow up there after.   General: Well developed, well nourished, male appearing in no acute distress. Head: Normocephalic, atraumatic.  Neck: Supple without bruits, JVD. Lungs:  Resp regular and unlabored, CTA. Heart: RRR, S1, S2, no S3, S4, or murmur; no rub. Abdomen: Soft, non-tender, non-distended with normoactive bowel sounds. No hepatomegaly. No rebound/guarding. No obvious abdominal masses. Extremities: No clubbing, cyanosis, edema. Distal pedal pulses are 2+ bilaterally. Right radial cath site stable without bruising or hematoma Neuro: Alert and oriented X 3. Moves all extremities spontaneously. Psych: Normal affect.  Did the patient have an acute coronary syndrome (MI, NSTEMI, STEMI, etc) this admission?:  No                               Did the patient have a percutaneous coronary intervention (stent / angioplasty)?:  Yes.     Cath/PCI Registry Performance & Quality Measures: 4. Aspirin prescribed? - Yes 5. ADP Receptor Inhibitor (Plavix/Clopidogrel, Brilinta/Ticagrelor or Effient/Prasugrel) prescribed (includes medically managed patients)? - Yes 6. High Intensity Statin  (Lipitor 40-80mg  or Crestor 20-40mg ) prescribed? - Yes 7. For EF <40%, was ACEI/ARB prescribed? - No - Reason:  blood pressures remained soft 8. For EF <40%, Aldosterone Antagonist (Spironolactone or Eplerenone) prescribed? - No - Reason:  blood pressures remained soft 9. Cardiac Rehab Phase II ordered? - Yes   _____________  Discharge Vitals Blood pressure (!) 81/65, pulse (!) 56, temperature 98.1 F (36.7 C), temperature source Oral, resp. rate 18, height 5\' 9"  (1.753 m), weight 73.4 kg, SpO2 94 %.  Filed Weights   01/22/20 0845 01/23/20 0505  Weight: 73.9 kg 73.4 kg    Labs & Radiologic Studies    CBC Recent Labs    01/23/20 0211  WBC 8.2  HGB 15.1  HCT 45.0  MCV 95.7  PLT 341   Basic Metabolic Panel Recent Labs    01/23/20 0211  NA 138  K 3.7  CL 104  CO2 23  GLUCOSE 101*  BUN 11  CREATININE 1.15  CALCIUM 8.9   Liver Function Tests No results for input(s): AST, ALT, ALKPHOS, BILITOT, PROT, ALBUMIN in the last 72 hours. No results for input(s): LIPASE, AMYLASE in  the last 72 hours. High Sensitivity Troponin:   No results for input(s): TROPONINIHS in the last 720 hours.  BNP Invalid input(s): POCBNP D-Dimer No results for input(s): DDIMER in the last 72 hours. Hemoglobin A1C No results for input(s): HGBA1C in the last 72 hours. Fasting Lipid Panel No results for input(s): CHOL, HDL, LDLCALC, TRIG, CHOLHDL, LDLDIRECT in the last 72 hours. Thyroid Function Tests No results for input(s): TSH, T4TOTAL, T3FREE, THYROIDAB in the last 72 hours.  Invalid input(s): FREET3 _____________  CARDIAC CATHETERIZATION  Result Date: 01/22/2020  There is mild to moderate left ventricular systolic dysfunction. The left ventricular ejection fraction is 35-45% by visual estimate. -> LAD wall motion abnormality  LV end diastolic pressure is normal.  -------------------------------------------  CULPRIT LESION: Mid LAD to Dist LAD lesion is 100% stenosed.  A drug-eluting  stent was successfully placed covering the distal portion of the occlusion, using a STENT RESOLUTE ONYX 2.5X38. -> Postdilated tapered manner from 3.1 to 2.7 mm  A second proximal overlapping drug-eluting stent was successfully placed using a STENT RESOLUTE ONYX 3.0X12. -> Postdilated to 3.4 mm  Post intervention, there is a 0% residual stenosis.  4th Diag lesion is 50% stenosed. Dist LAD lesion is 75% stenosed.  Prox RCA-1 lesion is 25% stenosed. Prox RCA-2 lesion is 25% stenosed.  SUMMARY  Severe single-vessel disease with 100% CTO of the LAD just after 2 diagonal branches (1st & 2nd diag) and 1st Sep branch; otherwise no significant CAD.  Successful CTO PCI of LAD with antegrade wiring.  Somewhat difficult procedure with difficult wiring, stepwise balloon angioplasty and 2 overlapping stents required.  Brief interval of hypotension just prior to stent deployment, responded to short term IV Levophed + 250 mL NS bolus. RECOMMENDATION  Due to brief episode of hypotension and TIMI II flow apical flow in the LAD, we will monitor him overnight.  DAPT uninterrupted 6 months, but would continue Plavix lifelong due to long extensive LAD stent.  Initiate RF modification: Lipid management, beta-blocker, etc. Glenetta Hew, M.D., M.S. Interventional Cardiologist Pager # [phone] Phone # [phone] [address] [place], [place] [address]  MYOCARDIAL PERFUSION IMAGING  Result Date: 01/17/2020  The left ventricular ejection fraction is moderately decreased (30-44%).  Nuclear stress EF: 42%.  There was no ST segment deviation noted during stress.  Defect 1: There is a large defect of moderate severity present in the mid anterior, mid inferoseptal, apical anterior, apical septal and apex location.  Defect 2: There is a defect present in the apical lateral location.  Findings consistent with ischemia in the LAD territory.  This is a high risk study.    Disposition   Pt is being discharged  home today in good condition.  Follow-up Plans & Appointments     Follow-up Information    O'Neal, Cassie Freer, MD Follow up on 02/05/2020.   Specialties: Internal Medicine, Cardiology, Radiology Why: at 1:40pm for your follow up appt.  Contact information: [place] [address] [phone]              Discharge Instructions    Amb Referral to Cardiac Rehabilitation   Complete by: As directed    Diagnosis: Coronary Stents   After initial evaluation and assessments completed: Virtual Based Care may be provided alone or in conjunction with Phase 2 Cardiac Rehab based on patient barriers.: Yes   Call MD for:  redness, tenderness, or signs of infection (pain, swelling, redness, odor or green/yellow discharge around incision site)  Complete by: As directed    Diet - low sodium heart healthy   Complete by: As directed    Discharge instructions   Complete by: As directed    Radial Site Care Refer to this sheet in the next few weeks. These instructions provide you with information on caring for yourself after your procedure. Your caregiver may also give you more specific instructions. Your treatment has been planned according to current medical practices, but problems sometimes occur. Call your caregiver if you have any problems or questions after your procedure. HOME CARE INSTRUCTIONS You may shower the day after the procedure.Remove the bandage (dressing) and gently wash the site with plain soap and water.Gently pat the site dry.  Do not apply powder or lotion to the site.  Do not submerge the affected site in water for 3 to 5 days.  Inspect the site at least twice daily.  Do not flex or bend the affected arm for 24 hours.  No lifting over 5 pounds (2.3 kg) for 5 days after your procedure.  Do not drive home if you are discharged the same day of the procedure. Have someone else drive you.  You may drive 24 hours after the procedure unless otherwise  instructed by your caregiver.  What to expect: Any bruising will usually fade within 1 to 2 weeks.  Blood that collects in the tissue (hematoma) may be painful to the touch. It should usually decrease in size and tenderness within 1 to 2 weeks.  SEEK IMMEDIATE MEDICAL CARE IF: You have unusual pain at the radial site.  You have redness, warmth, swelling, or pain at the radial site.  You have drainage (other than a small amount of blood on the dressing).  You have chills.  You have a fever or persistent symptoms for more than 72 hours.  You have a fever and your symptoms suddenly get worse.  Your arm becomes pale, cool, tingly, or numb.  You have heavy bleeding from the site. Hold pressure on the site.   PLEASE DO NOT MISS ANY DOSES OF YOUR PLAVIX!!!!! Also keep a log of you blood pressures and bring back to your follow up appt. Please call the office with any questions.   Patients taking blood thinners should generally stay away from medicines like ibuprofen, Advil, Motrin, naproxen, and Aleve due to risk of stomach bleeding. You may take Tylenol as directed or talk to your primary doctor about alternatives.  Some studies suggest Prilosec/Omeprazole interacts with Plavix. We changed your Prilosec/Omeprazole to the equivalent dose of Protonix for less chance of interaction.   Increase activity slowly   Complete by: As directed       Discharge Medications   Allergies as of 01/23/2020   No Known Allergies     Medication List    STOP taking these medications   omeprazole 20 MG capsule Commonly known as: PRILOSEC     TAKE these medications   acetaminophen 500 MG tablet Commonly known as: TYLENOL Take 1,000 mg by mouth every 6 (six) hours as needed for moderate pain or headache.   albuterol 108 (90 Base) MCG/ACT inhaler Commonly known as: VENTOLIN HFA Inhale 2 puffs into the lungs every 6 (six) hours as needed for shortness of breath.   aspirin EC 81 MG tablet Take 1 tablet  (81 mg total) by mouth daily. Swallow whole.   clopidogrel 75 MG tablet Commonly known as: PLAVIX Take 1 tablet (75 mg total) by mouth daily with breakfast.  diphenhydrAMINE 25 MG tablet Commonly known as: BENADRYL Take 25 mg by mouth daily as needed for allergies.   nitroGLYCERIN 0.4 MG SL tablet Commonly known as: NITROSTAT Place 1 tablet (0.4 mg total) under the tongue every 5 (five) minutes as needed for chest pain.   pantoprazole 40 MG tablet Commonly known as: Protonix Take 1 tablet (40 mg total) by mouth daily.   rosuvastatin 20 MG tablet Commonly known as: CRESTOR Take 1 tablet (20 mg total) by mouth daily.   Symbicort 80-4.5 MCG/ACT inhaler Generic drug: budesonide-formoterol Inhale 2 puffs into the lungs 2 (two) times daily as needed (shortness of breath).   Vitamin D-3 25 MCG (1000 UT) Caps Take 1,000 Units by mouth daily.          Outstanding Labs/Studies   N/a   Duration of Discharge Encounter   Greater than 30 minutes including physician time.  Signed, Reino Bellis, NP 01/23/2020, 9:36 AM  I have personally seen and examined this patient. I agree with the assessment and plan as outlined above. He is ready for d/c today post PCI of the LAD. He will go home with ASA and Plavix. D/C home today.   Lauree Chandler 01/23/2020 9:36 AM

## 2020-01-23 NOTE — Progress Notes (Signed)
CARDIAC REHAB PHASE I   PRE:  Rate/Rhythm: 76 SR  BP:  Supine:   Sitting: 113/80  Standing:    SaO2: 98%RA  MODE:  Ambulation: 470 ft   POST:  Rate/Rhythm: 87 SR  BP:  Supine:   Sitting: 119/72  Standing:    SaO2: 96%RA [id number] Waited for pt to finish breakfast prior to walking. Pt walked 470 ft on RA with steady gait and no CP. Tolerated well. Stressed importance of plavix with stent. Reviewed NTG use, heart healthy and low sodium food choices, walking for ex, signs/symptoms of CHF with low EF, daily weights and 2000 mg sodium restriction, CRP 2. Referred to [facility] program. Pt encouraged to read information given, voiced understanding at this time.   Graylon Good, RN BSN  01/23/2020 9:15 AM

## 2020-01-25 ENCOUNTER — Telehealth: Payer: Self-pay | Admitting: Cardiovascular Disease

## 2020-01-25 NOTE — Telephone Encounter (Signed)
Spoke with patient and informed patient of Dr. Kathalene Frames recommendations. Patient verbalized understanding.

## 2020-01-25 NOTE — Telephone Encounter (Signed)
Not uncommon after his procedure. If he has pain that will not respond  to nitro (3 doses, 3-5 minutes apart, 15 minutes total), he should go to the ER.   [place] T. Audie Box, [place]  [address] [place], [place] [address] [phone]  3:51 PM

## 2020-01-25 NOTE — Telephone Encounter (Signed)
Spoke with patient. Patient was walking when he had a "sharp, little pain" come on the right side of his chest for about 2 seconds. Patient took nitroglycerin which relieved the pain. The pain was not accompanied by other symptoms. The pain is not occurring at rest. Patient had 2 stents placed on Monday and this is the first time pain has occurred since then.   Patient has a follow up appointment for 11/15 with Dr. Audie Box. Patient advised not to exert himself and to lay low for the rest of today. If patient has active chest pain again he will call back and report to the emergency room.   Message to be sent to Dr. Audie Box for review.

## 2020-01-25 NOTE — Telephone Encounter (Signed)
    Pt c/o of Chest Pain: STAT if CP now or developed within 24 hours  1. Are you having CP right now? No  2. Are you experiencing any other symptoms (ex. SOB, nausea, vomiting, sweating)? No  3. How long have you been experiencing CP? 15 mins ago   4. Is your CP continuous or coming and going? coming and going  5. Have you taken Nitroglycerin? Yes  Pt felt CP lasted 2 seconds he took nitroglycerin and feels better. He wanted to know what he can do next

## 2020-01-30 ENCOUNTER — Telehealth (HOSPITAL_COMMUNITY): Payer: Self-pay

## 2020-01-30 NOTE — Telephone Encounter (Signed)
Pt returned CR phone call, went over insurance. Pt stated he is unable to participate in CR at this time due to the co-pay.  Closed referral

## 2020-01-30 NOTE — Telephone Encounter (Signed)
Attempted to call patient in regards to Cardiac Rehab - LM on VM 

## 2020-01-30 NOTE — Telephone Encounter (Signed)
Pt insurance is active and benefits verified through [facility]. Co-pay $45.00, DED $0.00/$0.00 met, out of pocket $5,000.00/$434.76 met, co-insurance 0%. No pre-authorization required. Randy/[employer], 01/30/20 @ 939AM, VCB#[id number]  Will contact patient to see if he is interested in the Cardiac Rehab Program. If interested, patient will need to complete follow up appt. Once completed, patient will be contacted for scheduling upon review by the RN Navigator.

## 2020-02-02 ENCOUNTER — Other Ambulatory Visit: Payer: Self-pay

## 2020-02-02 ENCOUNTER — Ambulatory Visit (HOSPITAL_COMMUNITY): Payer: Medicare HMO | Attending: Internal Medicine

## 2020-02-02 DIAGNOSIS — I200 Unstable angina: Secondary | ICD-10-CM

## 2020-02-02 DIAGNOSIS — R9439 Abnormal result of other cardiovascular function study: Secondary | ICD-10-CM

## 2020-02-02 LAB — ECHOCARDIOGRAM COMPLETE
Area-P 1/2: 2.34 cm2
S' Lateral: 3.5 cm

## 2020-02-04 NOTE — Progress Notes (Signed)
Cardiology Office Note:   Date:  02/05/2020  NAME:  Marcus Hopkins    MRN: [id number] DOB:  January 13, 1941   PCP:  Lavone Orn, MD  Cardiologist:  Evalina Field, MD   Referring MD: Lavone Orn, MD   Chief Complaint  Patient presents with  . Follow-up   History of Present Illness:   Marcus Hopkins is a 79 y.o. male with a hx of CAD, HLD who presents for follow-up. Seen 10/27 with abnormal stress. Underwent LHC 01/22/2020 with CTO of LAD that underwent PCI.  He reports he is doing well.  Walking up to 40 minutes a day without any limitations.  Has no further episodes of chest pain or pressure.  He is tolerating aspirin and Plavix well.  No bleeding issues.  We do need to get a fasting lipid profile in a few months.  He understands this.  He presents with his wife today.  We spent an extensive amount of time going over diet and exercise regimens for him.  He is exercising well and is working on his diet.  Blood pressures well controlled.  He is on no medications.  He had to take no further nitroglycerin pills.  He did have a little bit of chest pain after his cath but this is resolved.  Overall doing well today without complaints.  Problem List 1. CAD -CTO prox LAD -> PCI 01/22/2020 2. HLD -T chol 201, HDL 60, LDL 124, TG 86  Past Medical History: Past Medical History:  Diagnosis Date  . Allergic rhinitis   . Borderline hyperlipidemia   . Cataracts, bilateral   . Hemorrhoids   . Insomnia     Past Surgical History: Past Surgical History:  Procedure Laterality Date  . CHOLECYSTECTOMY    . CORONARY CTO INTERVENTION N/A 01/22/2020   Procedure: CORONARY CTO INTERVENTION;  Surgeon: Leonie Man, MD;  Location: [facility];  Service: Cardiovascular;  Laterality: N/A;  . ESOPHAGOGASTRODUODENOSCOPY  11/29/2011   Procedure: ESOPHAGOGASTRODUODENOSCOPY (EGD);  Surgeon: Arta Silence, MD;  Location: [facility];  Service: Endoscopy;  Laterality: N/A;  . LEFT HEART CATH AND  CORONARY ANGIOGRAPHY N/A 01/22/2020   Procedure: LEFT HEART CATH AND CORONARY ANGIOGRAPHY;  Surgeon: Leonie Man, MD;  Location: [facility];  Service: Cardiovascular;  Laterality: N/A;  . ROTATOR CUFF REPAIR      Current Medications: Current Meds  Medication Sig  . acetaminophen (TYLENOL) 500 MG tablet Take 1,000 mg by mouth every 6 (six) hours as needed for moderate pain or headache.  . albuterol (VENTOLIN HFA) 108 (90 Base) MCG/ACT inhaler Inhale 2 puffs into the lungs every 6 (six) hours as needed for shortness of breath.  Marland Kitchen aspirin EC 81 MG tablet Take 1 tablet (81 mg total) by mouth daily. Swallow whole.  . Cholecalciferol (VITAMIN D-3) 25 MCG (1000 UT) CAPS Take 1,000 Units by mouth daily.  . clopidogrel (PLAVIX) 75 MG tablet Take 1 tablet (75 mg total) by mouth daily with breakfast.  . diphenhydrAMINE (BENADRYL) 25 MG tablet Take 25 mg by mouth daily as needed for allergies.  . nitroGLYCERIN (NITROSTAT) 0.4 MG SL tablet Place 1 tablet (0.4 mg total) under the tongue every 5 (five) minutes as needed for chest pain.  . rosuvastatin (CRESTOR) 20 MG tablet Take 1 tablet (20 mg total) by mouth daily.  . SYMBICORT 80-4.5 MCG/ACT inhaler Inhale 2 puffs into the lungs 2 (two) times daily as needed (shortness of breath).   . [DISCONTINUED]  clopidogrel (PLAVIX) 75 MG tablet Take 1 tablet (75 mg total) by mouth daily with breakfast.  . [DISCONTINUED] pantoprazole (PROTONIX) 40 MG tablet Take 1 tablet (40 mg total) by mouth daily.     Allergies:    Patient has no known allergies.   Social History: Social History   Socioeconomic History  . Marital status: Married    Spouse name: Not on file  . Number of children: Not on file  . Years of education: Not on file  . Highest education level: Not on file  Occupational History  . Occupation: Tree surgeon  Tobacco Use  . Smoking status: Former Smoker    Years: 30.00    Quit date: 03/23/1993    Years since quitting: 26.8  .  Smokeless tobacco: Never Used  Substance and Sexual Activity  . Alcohol use: Yes    Alcohol/week: 2.0 standard drinks    Types: 2 Glasses of wine per week  . Drug use: No  . Sexual activity: Not on file  Other Topics Concern  . Not on file  Social History Narrative  . Not on file   Social Determinants of Health   Financial Resource Strain:   . Difficulty of Paying Living Expenses: Not on file  Food Insecurity:   . Worried About Charity fundraiser in the Last Year: Not on file  . Ran Out of Food in the Last Year: Not on file  Transportation Needs:   . Lack of Transportation (Medical): Not on file  . Lack of Transportation (Non-Medical): Not on file  Physical Activity:   . Days of Exercise per Week: Not on file  . Minutes of Exercise per Session: Not on file  Stress:   . Feeling of Stress : Not on file  Social Connections:   . Frequency of Communication with Friends and Family: Not on file  . Frequency of Social Gatherings with Friends and Family: Not on file  . Attends Religious Services: Not on file  . Active Member of Clubs or Organizations: Not on file  . Attends Archivist Meetings: Not on file  . Marital Status: Not on file     Family History: The patient's family history includes Heart attack in his father.  ROS:   All other ROS reviewed and negative. Pertinent positives noted in the HPI.     EKGs/Labs/Other Studies Reviewed:   The following studies were personally reviewed by me today:  TTE 02/02/2020 1. LVEF is approximately 50% with hypokinesis of the distal anterior,  distal lateral and apical walls.  2. Right ventricular systolic function is normal. The right ventricular  size is normal.  3. Trivial mitral valve regurgitation.   LHC 01/22/2020 SUMMARY  Severe single-vessel disease with 100% CTO of the LAD just after 2 diagonal branches (1st & 2nd diag) and 1st Sep branch; otherwise no significant CAD.  Successful CTO PCI of LAD with  antegrade wiring.  Somewhat difficult procedure with difficult wiring, stepwise balloon angioplasty and 2 overlapping stents required.  Brief interval of hypotension just prior to stent deployment, responded to short term IV Levophed + 250 mL NS bolus.   Recent Labs: 01/23/2020: BUN 11; Creatinine, Ser 1.15; Hemoglobin 15.1; Platelets 261; Potassium 3.7; Sodium 138   Recent Lipid Panel No results found for: CHOL, TRIG, HDL, CHOLHDL, VLDL, LDLCALC, LDLDIRECT  Physical Exam:   VS:  BP 114/68 (BP Location: Left Arm, Patient Position: Sitting, Cuff Size: Normal)   Pulse 68   Ht 5\' 9"  (  1.753 m)   Wt 161 lb (73 kg)   BMI 23.78 kg/m    Wt Readings from Last 3 Encounters:  02/05/20 161 lb (73 kg)  01/23/20 161 lb 12.8 oz (73.4 kg)  01/17/20 165 lb (74.8 kg)    General: Well nourished, well developed, in no acute distress Heart: Atraumatic, normal size  Eyes: PEERLA, EOMI  Neck: Supple, no JVD Endocrine: No thryomegaly Cardiac: Normal S1, S2; RRR; no murmurs, rubs, or gallops Lungs: Clear to auscultation bilaterally, no wheezing, rhonchi or rales  Abd: Soft, nontender, no hepatomegaly  Ext: No edema, pulses 2+, right radial cath site clean and dry without hematoma or bruit Musculoskeletal: No deformities, BUE and BLE strength normal and equal Skin: Warm and dry, no rashes   Neuro: Alert and oriented to person, place, time, and situation, CNII-XII grossly intact, no focal deficits  Psych: Normal mood and affect   ASSESSMENT:   Lynell SHELVY PERAZZO is a 79 y.o. male who presents for the following: 1. Coronary artery disease involving native coronary artery of native heart without angina pectoris   2. Mixed hyperlipidemia     PLAN:   1. Coronary artery disease involving native coronary artery of native heart without angina pectoris -High risk stress test.  Found to have basically a CTO of the LAD.  Underwent intervention.  No further chest pain or pressure.  He will continue aspirin and  Plavix for 1 year. -He is on Crestor.  He will have a repeat lipid profile before I see him back in 3 months. -Blood pressure well controlled without medications. -ok to continue exercise without restrictions.   2. Mixed hyperlipidemia -Continue Crestor.  Repeat lipid profile in 3 months.  Disposition: Return in about 3 months (around 05/07/2020).  Medication Adjustments/Labs and Tests Ordered: Current medicines are reviewed at length with the patient today.  Concerns regarding medicines are outlined above.  Orders Placed This Encounter  Procedures  . Lipid panel  . Hepatic function panel   Meds ordered this encounter  Medications  . clopidogrel (PLAVIX) 75 MG tablet    Sig: Take 1 tablet (75 mg total) by mouth daily with breakfast.    Dispense:  90 tablet    Refill:  2    Patient Instructions  Medication Instructions:  STOP PANTOPRAZOLE  *If you need a refill on your cardiac medications before your next appointment, please call your pharmacy*   Lab Work: FASTING LIPID AND LIVER IN 3 MONTHS  If you have labs (blood work) drawn today and your tests are completely normal, you will receive your results only by: Marland Kitchen MyChart Message (if you have MyChart) OR . A paper copy in the mail If you have any lab test that is abnormal or we need to change your treatment, we will call you to review the results.   Testing/Procedures: NONE   Follow-Up: At [facility], you and your health needs are our priority.  As part of our continuing mission to provide you with exceptional heart care, we have created designated Provider Care Teams.  These Care Teams include your primary Cardiologist (physician) and Advanced Practice Providers (APPs -  Physician Assistants and Nurse Practitioners) who all work together to provide you with the care you need, when you need it.  We recommend signing up for the patient portal called "MyChart".  Sign up information is provided on this After Visit Summary.   MyChart is used to connect with patients for Virtual Visits (Telemedicine).  Patients are able  to view lab/test results, encounter notes, upcoming appointments, etc.  Non-urgent messages can be sent to your provider as well.   To learn more about what you can do with MyChart, go to [email or web address].    Your next appointment:   3 month(s)  The format for your next appointment:   In Person  Provider:   Eleonore Chiquito, MD   Other Instructions NONE     Time Spent with Patient: I have spent a total of 25 minutes with patient reviewing hospital notes, telemetry, EKGs, labs and examining the patient as well as establishing an assessment and plan that was discussed with the patient.  > 50% of time was spent in direct patient care.  Signed, Addison Naegeli. Audie Box, [place]  [address] [place], [place] [address] [phone]  02/05/2020 2:23 PM

## 2020-02-05 ENCOUNTER — Ambulatory Visit: Payer: Medicare HMO | Admitting: Cardiovascular Disease

## 2020-02-05 ENCOUNTER — Encounter: Payer: Self-pay | Admitting: Cardiovascular Disease

## 2020-02-05 ENCOUNTER — Other Ambulatory Visit: Payer: Self-pay

## 2020-02-05 VITALS — BP 114/68 | HR 68 | Ht 69.0 in | Wt 161.0 lb

## 2020-02-05 DIAGNOSIS — I2510 Atherosclerotic heart disease of native coronary artery without angina pectoris: Secondary | ICD-10-CM

## 2020-02-05 DIAGNOSIS — E782 Mixed hyperlipidemia: Secondary | ICD-10-CM

## 2020-02-05 MED ORDER — CLOPIDOGREL BISULFATE 75 MG PO TABS
75.0000 mg | ORAL_TABLET | Freq: Every day | ORAL | 2 refills | Status: DC
Start: 2020-02-05 — End: 2021-06-16

## 2020-02-05 NOTE — Patient Instructions (Signed)
Medication Instructions:  STOP PANTOPRAZOLE  *If you need a refill on your cardiac medications before your next appointment, please call your pharmacy*   Lab Work: FASTING LIPID AND LIVER IN 3 MONTHS  If you have labs (blood work) drawn today and your tests are completely normal, you will receive your results only by: Marland Kitchen MyChart Message (if you have MyChart) OR . A paper copy in the mail If you have any lab test that is abnormal or we need to change your treatment, we will call you to review the results.   Testing/Procedures: NONE   Follow-Up: At [facility], you and your health needs are our priority.  As part of our continuing mission to provide you with exceptional heart care, we have created designated Provider Care Teams.  These Care Teams include your primary Cardiologist (physician) and Advanced Practice Providers (APPs -  Physician Assistants and Nurse Practitioners) who all work together to provide you with the care you need, when you need it.  We recommend signing up for the patient portal called "MyChart".  Sign up information is provided on this After Visit Summary.  MyChart is used to connect with patients for Virtual Visits (Telemedicine).  Patients are able to view lab/test results, encounter notes, upcoming appointments, etc.  Non-urgent messages can be sent to your provider as well.   To learn more about what you can do with MyChart, go to [email or web address].    Your next appointment:   3 month(s)  The format for your next appointment:   In Person  Provider:   Eleonore Chiquito, MD   Other Instructions NONE

## 2020-02-09 DIAGNOSIS — I2510 Atherosclerotic heart disease of native coronary artery without angina pectoris: Secondary | ICD-10-CM | POA: Diagnosis not present

## 2020-02-09 DIAGNOSIS — E7800 Pure hypercholesterolemia, unspecified: Secondary | ICD-10-CM | POA: Diagnosis not present

## 2020-02-09 DIAGNOSIS — Z1389 Encounter for screening for other disorder: Secondary | ICD-10-CM | POA: Diagnosis not present

## 2020-02-09 DIAGNOSIS — Z0000 Encounter for general adult medical examination without abnormal findings: Secondary | ICD-10-CM | POA: Diagnosis not present

## 2020-05-09 ENCOUNTER — Other Ambulatory Visit: Payer: Self-pay

## 2020-05-09 DIAGNOSIS — E782 Mixed hyperlipidemia: Secondary | ICD-10-CM

## 2020-05-09 DIAGNOSIS — I2510 Atherosclerotic heart disease of native coronary artery without angina pectoris: Secondary | ICD-10-CM

## 2020-05-10 DIAGNOSIS — I2510 Atherosclerotic heart disease of native coronary artery without angina pectoris: Secondary | ICD-10-CM | POA: Diagnosis not present

## 2020-05-10 DIAGNOSIS — E782 Mixed hyperlipidemia: Secondary | ICD-10-CM | POA: Diagnosis not present

## 2020-05-10 LAB — LIPID PANEL
Chol/HDL Ratio: 2.2 ratio (ref 0.0–5.0)
Cholesterol, Total: 132 mg/dL (ref 100–199)
HDL: 61 mg/dL (ref >39)
LDL Chol Calc (NIH): 58 mg/dL (ref 0–99)
Triglycerides: 62 mg/dL (ref 0–149)
VLDL Cholesterol Cal: 13 mg/dL (ref 5–40)

## 2020-05-10 LAB — HEPATIC FUNCTION PANEL
ALT: 33 IU/L (ref 0–44)
AST: 30 IU/L (ref 0–40)
Albumin: 4.2 g/dL (ref 3.7–4.7)
Alkaline Phosphatase: 71 IU/L (ref 44–121)
Bilirubin Total: 0.5 mg/dL (ref 0.0–1.2)
Bilirubin, Direct: 0.18 mg/dL (ref 0.00–0.40)
Total Protein: 6.5 g/dL (ref 6.0–8.5)

## 2020-05-12 NOTE — Progress Notes (Signed)
Cardiology Office Note:   Date:  05/14/2020  NAME:  Marcus Hopkins    MRN: [id number] DOB:  1940/05/21   PCP:  Lavone Orn, MD  Cardiologist:  Evalina Field, MD   Referring MD: Lavone Orn, MD   Chief Complaint  Patient presents with  . Coronary Artery Disease   History of Present Illness:   Marcus Hopkins is a 80 y.o. male with a hx of CAD, HLD who presents for follow-up. HLD at goal.  He reports he is doing well.  No symptoms of chest pain or trouble breathing.  He is walking 3 to 4 miles per day.  Most recent LDL cholesterol is at goal.  We discussed continuing aspirin Plavix through May 1.  He will then continue aspirin indefinitely and drop Plavix.  He is okay to do this.  Blood pressure well controlled.  Overall doing remarkably well without any symptoms.  Problem List 1. CAD -CTO prox LAD -> PCI 01/22/2020 2. HLD -T chol 132, HDL 61, LDL 58, TG 62  Past Medical History: Past Medical History:  Diagnosis Date  . Allergic rhinitis   . Borderline hyperlipidemia   . Cataracts, bilateral   . Hemorrhoids   . Insomnia     Past Surgical History: Past Surgical History:  Procedure Laterality Date  . CHOLECYSTECTOMY    . CORONARY CTO INTERVENTION N/A 01/22/2020   Procedure: CORONARY CTO INTERVENTION;  Surgeon: Leonie Man, MD;  Location: [facility];  Service: Cardiovascular;  Laterality: N/A;  . ESOPHAGOGASTRODUODENOSCOPY  11/29/2011   Procedure: ESOPHAGOGASTRODUODENOSCOPY (EGD);  Surgeon: Arta Silence, MD;  Location: [facility];  Service: Endoscopy;  Laterality: N/A;  . LEFT HEART CATH AND CORONARY ANGIOGRAPHY N/A 01/22/2020   Procedure: LEFT HEART CATH AND CORONARY ANGIOGRAPHY;  Surgeon: Leonie Man, MD;  Location: [facility];  Service: Cardiovascular;  Laterality: N/A;  . ROTATOR CUFF REPAIR      Current Medications: Current Meds  Medication Sig  . acetaminophen (TYLENOL) 500 MG tablet Take 1,000 mg by mouth every 6 (six) hours as  needed for moderate pain or headache.  . albuterol (VENTOLIN HFA) 108 (90 Base) MCG/ACT inhaler Inhale 2 puffs into the lungs every 6 (six) hours as needed for shortness of breath.  Marland Kitchen aspirin EC 81 MG tablet Take 1 tablet (81 mg total) by mouth daily. Swallow whole.  . Cholecalciferol (VITAMIN D-3) 25 MCG (1000 UT) CAPS Take 1,000 Units by mouth daily.  . clopidogrel (PLAVIX) 75 MG tablet Take 1 tablet (75 mg total) by mouth daily with breakfast.  . diphenhydrAMINE (BENADRYL) 25 MG tablet Take 25 mg by mouth daily as needed for allergies.  . nitroGLYCERIN (NITROSTAT) 0.4 MG SL tablet Place 1 tablet (0.4 mg total) under the tongue every 5 (five) minutes as needed for chest pain.  Marland Kitchen omeprazole (PRILOSEC) 20 MG capsule   . rosuvastatin (CRESTOR) 20 MG tablet Take 1 tablet (20 mg total) by mouth daily.  . SYMBICORT 80-4.5 MCG/ACT inhaler Inhale 2 puffs into the lungs 2 (two) times daily as needed (shortness of breath).      Allergies:    Patient has no known allergies.   Social History: Social History   Socioeconomic History  . Marital status: Married    Spouse name: Not on file  . Number of children: Not on file  . Years of education: Not on file  . Highest education level: Not on file  Occupational History  . Occupation: Tree surgeon  Tobacco Use  . Smoking status: Former Smoker    Years: 30.00    Quit date: 03/23/1993    Years since quitting: 27.1  . Smokeless tobacco: Never Used  Substance and Sexual Activity  . Alcohol use: Yes    Alcohol/week: 2.0 standard drinks    Types: 2 Glasses of wine per week  . Drug use: No  . Sexual activity: Not on file  Other Topics Concern  . Not on file  Social History Narrative  . Not on file   Social Determinants of Health   Financial Resource Strain: Not on file  Food Insecurity: Not on file  Transportation Needs: Not on file  Physical Activity: Not on file  Stress: Not on file  Social Connections: Not on file     Family  History: The patient's family history includes Heart attack in his father.  ROS:   All other ROS reviewed and negative. Pertinent positives noted in the HPI.     EKGs/Labs/Other Studies Reviewed:   The following studies were personally reviewed by me today:  [place] 01/22/2020  SUMMARY  Severe single-vessel disease with 100% CTO of the LAD just after 2 diagonal branches (1st & 2nd diag) and 1st Sep branch; otherwise no significant CAD.  Successful CTO PCI of LAD with antegrade wiring.  Somewhat difficult procedure with difficult wiring, stepwise balloon angioplasty and 2 overlapping stents required.  Brief interval of hypotension just prior to stent deployment, responded to short term IV Levophed + 250 mL NS bolus.   RECOMMENDATION  Due to brief episode of hypotension and TIMI II flow apical flow in the LAD, we will monitor him overnight.  DAPT uninterrupted 6 months, but would continue Plavix lifelong due to long extensive LAD stent.  Initiate RF modification: Lipid management, beta-blocker, etc.  TTE 02/02/2020 1. LVEF is approximately 50% with hypokinesis of the distal anterior,  distal lateral and apical walls.  2. Right ventricular systolic function is normal. The right ventricular  size is normal.  3. Trivial mitral valve regurgitation.   Recent Labs: 01/23/2020: BUN 11; Creatinine, Ser 1.15; Hemoglobin 15.1; Platelets 261; Potassium 3.7; Sodium 138 05/10/2020: ALT 33   Recent Lipid Panel    Component Value Date/Time   CHOL 132 05/10/2020 0845   TRIG 62 05/10/2020 0845   HDL 61 05/10/2020 0845   CHOLHDL 2.2 05/10/2020 0845   LDLCALC 58 05/10/2020 0845    Physical Exam:   VS:  BP 130/70   Pulse 60   Ht 5\' 9"  (1.753 m)   Wt 153 lb 9.6 oz (69.7 kg)   SpO2 94%   BMI 22.68 kg/m    Wt Readings from Last 3 Encounters:  05/14/20 153 lb 9.6 oz (69.7 kg)  02/05/20 161 lb (73 kg)  01/23/20 161 lb 12.8 oz (73.4 kg)    General: Well nourished, well developed, in  no acute distress Head: Atraumatic, normal size  Eyes: PEERLA, EOMI  Neck: Supple, no JVD Endocrine: No thryomegaly Cardiac: Normal S1, S2; RRR; no murmurs, rubs, or gallops Lungs: Clear to auscultation bilaterally, no wheezing, rhonchi or rales  Abd: Soft, nontender, no hepatomegaly  Ext: No edema, pulses 2+ Musculoskeletal: No deformities, BUE and BLE strength normal and equal Skin: Warm and dry, no rashes   Neuro: Alert and oriented to person, place, time, and situation, CNII-XII grossly intact, no focal deficits  Psych: Normal mood and affect   ASSESSMENT:   Marcus Hopkins is a 80 y.o. male who presents for the  following: 1. Coronary artery disease involving native coronary artery of native heart without angina pectoris   2. Mixed hyperlipidemia     PLAN:   1. Coronary artery disease involving native coronary artery of native heart without angina pectoris 2. Mixed hyperlipidemia --CTO prox LAD -> PCI 01/22/2020 -Doing well.  No chest pain.  LDL at goal.  He will continue aspirin and statin therapy.  Okay to stop Plavix on 07/21/2020.  He said no symptoms concerning for angina.  Has not had any nitroglycerin.  Overall doing remarkably well.  Ejection fraction within normal limits.  He will see me back in 6 months.  Disposition: Return in about 6 months (around 11/11/2020).  Medication Adjustments/Labs and Tests Ordered: Current medicines are reviewed at length with the patient today.  Concerns regarding medicines are outlined above.  No orders of the defined types were placed in this encounter.  No orders of the defined types were placed in this encounter.   Patient Instructions  Medication Instructions:  Stop Plavix on 05/01  *If you need a refill on your cardiac medications before your next appointment, please call your pharmacy*  Follow-Up: At [facility], you and your health needs are our priority.  As part of our continuing mission to provide you with exceptional  heart care, we have created designated Provider Care Teams.  These Care Teams include your primary Cardiologist (physician) and Advanced Practice Providers (APPs -  Physician Assistants and Nurse Practitioners) who all work together to provide you with the care you need, when you need it.  We recommend signing up for the patient portal called "MyChart".  Sign up information is provided on this After Visit Summary.  MyChart is used to connect with patients for Virtual Visits (Telemedicine).  Patients are able to view lab/test results, encounter notes, upcoming appointments, etc.  Non-urgent messages can be sent to your provider as well.   To learn more about what you can do with MyChart, go to [email or web address].    Your next appointment:   6 month(s)  The format for your next appointment:   In Person  Provider:   Eleonore Chiquito, MD        Time Spent with Patient: I have spent a total of 25 minutes with patient reviewing hospital notes, telemetry, EKGs, labs and examining the patient as well as establishing an assessment and plan that was discussed with the patient.  > 50% of time was spent in direct patient care.  Signed, Addison Naegeli. Audie Box, [place]  [address] [place], [place] [address] [phone]  05/14/2020 12:15 PM

## 2020-05-14 ENCOUNTER — Other Ambulatory Visit: Payer: Self-pay

## 2020-05-14 ENCOUNTER — Encounter: Payer: Self-pay | Admitting: Cardiovascular Disease

## 2020-05-14 ENCOUNTER — Ambulatory Visit: Payer: Medicare HMO | Admitting: Cardiovascular Disease

## 2020-05-14 VITALS — BP 130/70 | HR 60 | Ht 69.0 in | Wt 153.6 lb

## 2020-05-14 DIAGNOSIS — I2510 Atherosclerotic heart disease of native coronary artery without angina pectoris: Secondary | ICD-10-CM

## 2020-05-14 DIAGNOSIS — E782 Mixed hyperlipidemia: Secondary | ICD-10-CM

## 2020-05-14 NOTE — Patient Instructions (Signed)
Medication Instructions:  Stop Plavix on 05/01  *If you need a refill on your cardiac medications before your next appointment, please call your pharmacy*  Follow-Up: At [facility], you and your health needs are our priority.  As part of our continuing mission to provide you with exceptional heart care, we have created designated Provider Care Teams.  These Care Teams include your primary Cardiologist (physician) and Advanced Practice Providers (APPs -  Physician Assistants and Nurse Practitioners) who all work together to provide you with the care you need, when you need it.  We recommend signing up for the patient portal called "MyChart".  Sign up information is provided on this After Visit Summary.  MyChart is used to connect with patients for Virtual Visits (Telemedicine).  Patients are able to view lab/test results, encounter notes, upcoming appointments, etc.  Non-urgent messages can be sent to your provider as well.   To learn more about what you can do with MyChart, go to [email or web address].    Your next appointment:   6 month(s)  The format for your next appointment:   In Person  Provider:   Eleonore Chiquito, MD

## 2020-07-08 DIAGNOSIS — R062 Wheezing: Secondary | ICD-10-CM | POA: Diagnosis not present

## 2020-07-08 DIAGNOSIS — T781XXD Other adverse food reactions, not elsewhere classified, subsequent encounter: Secondary | ICD-10-CM | POA: Diagnosis not present

## 2020-07-08 DIAGNOSIS — J3089 Other allergic rhinitis: Secondary | ICD-10-CM | POA: Diagnosis not present

## 2020-07-08 DIAGNOSIS — J301 Allergic rhinitis due to pollen: Secondary | ICD-10-CM | POA: Diagnosis not present

## 2020-07-31 DIAGNOSIS — J301 Allergic rhinitis due to pollen: Secondary | ICD-10-CM | POA: Diagnosis not present

## 2020-07-31 DIAGNOSIS — J209 Acute bronchitis, unspecified: Secondary | ICD-10-CM | POA: Diagnosis not present

## 2020-07-31 DIAGNOSIS — R062 Wheezing: Secondary | ICD-10-CM | POA: Diagnosis not present

## 2020-07-31 DIAGNOSIS — T781XXD Other adverse food reactions, not elsewhere classified, subsequent encounter: Secondary | ICD-10-CM | POA: Diagnosis not present

## 2020-11-11 ENCOUNTER — Telehealth: Payer: Self-pay | Admitting: Cardiology

## 2020-11-11 NOTE — Telephone Encounter (Signed)
Pt seen 04/2020 and AVS said to come back in 6 months.  Recall was placed for 1 year.  Spoke with pt and scheduled him to see Dr. Audie Box in Sept. Pt appreciative for call.

## 2020-11-11 NOTE — Telephone Encounter (Signed)
New message:      Patient calling stating that his discharge letter states he would like to see him in 08/22 however the recall states February. Patient is very addiment that he see him sooner. Please advise. I offered the next available.

## 2020-12-17 ENCOUNTER — Ambulatory Visit: Payer: Medicare HMO | Admitting: Cardiovascular Disease

## 2020-12-17 ENCOUNTER — Other Ambulatory Visit: Payer: Self-pay

## 2020-12-17 ENCOUNTER — Encounter: Payer: Self-pay | Admitting: Cardiovascular Disease

## 2020-12-17 VITALS — BP 116/78 | HR 76 | Ht 69.0 in | Wt 167.8 lb

## 2020-12-17 DIAGNOSIS — I2510 Atherosclerotic heart disease of native coronary artery without angina pectoris: Secondary | ICD-10-CM | POA: Diagnosis not present

## 2020-12-17 DIAGNOSIS — E782 Mixed hyperlipidemia: Secondary | ICD-10-CM

## 2020-12-17 NOTE — Progress Notes (Signed)
Cardiology Office Note:   Date:  12/17/2020  NAME:  Marcus Hopkins    MRN: [id number] DOB:  Dec 06, 1940   PCP:  Lavone Orn, MD  Cardiologist:  Evalina Field, MD  Electrophysiologist:  None   Referring MD: Lavone Orn, MD   Chief Complaint  Patient presents with   Coronary Artery Disease    History of Present Illness:   Marcus Hopkins is a 80 y.o. male with a hx of CAD status post PCI, hyperlipidemia who presents for follow-up.  He underwent PCI to the proximal LAD where he was found to have a CTO on 01/22/2020.  Completed 6 months of DAPT.  Remains on aspirin.  LDL cholesterol 58 which is at goal.  He denies any chest pain symptoms.  He is walking 2 to 4 miles per day without any limitations.  No chest tightness pressure or shortness of breath reported.  His blood pressure is 116/78.  He has not had to take any nitroglycerin.  Overall he is completely stable without symptoms of angina.  Problem List 1. CAD -CTO prox LAD -> PCI 01/22/2020 2. HLD -T chol 132, HDL 61, LDL 58, TG 62  Past Medical History: Past Medical History:  Diagnosis Date   Allergic rhinitis    Borderline hyperlipidemia    Cataracts, bilateral    Hemorrhoids    Insomnia     Past Surgical History: Past Surgical History:  Procedure Laterality Date   CHOLECYSTECTOMY     CORONARY CTO INTERVENTION N/A 01/22/2020   Procedure: CORONARY CTO INTERVENTION;  Surgeon: Leonie Man, MD;  Location: [facility];  Service: Cardiovascular;  Laterality: N/A;   ESOPHAGOGASTRODUODENOSCOPY  11/29/2011   Procedure: ESOPHAGOGASTRODUODENOSCOPY (EGD);  Surgeon: Arta Silence, MD;  Location: [facility];  Service: Endoscopy;  Laterality: N/A;   LEFT HEART CATH AND CORONARY ANGIOGRAPHY N/A 01/22/2020   Procedure: LEFT HEART CATH AND CORONARY ANGIOGRAPHY;  Surgeon: Leonie Man, MD;  Location: [facility];  Service: Cardiovascular;  Laterality: N/A;   ROTATOR CUFF REPAIR      Current  Medications: Current Meds  Medication Sig   acetaminophen (TYLENOL) 500 MG tablet Take 1,000 mg by mouth every 6 (six) hours as needed for moderate pain or headache.   albuterol (VENTOLIN HFA) 108 (90 Base) MCG/ACT inhaler Inhale 2 puffs into the lungs every 6 (six) hours as needed for shortness of breath.   aspirin EC 81 MG tablet Take 1 tablet (81 mg total) by mouth daily. Swallow whole.   Cholecalciferol (VITAMIN D-3) 25 MCG (1000 UT) CAPS Take 1,000 Units by mouth daily.   diphenhydrAMINE (BENADRYL) 25 MG tablet Take 25 mg by mouth daily as needed for allergies.   rosuvastatin (CRESTOR) 20 MG tablet Take 1 tablet (20 mg total) by mouth daily.   SYMBICORT 80-4.5 MCG/ACT inhaler Inhale 2 puffs into the lungs 2 (two) times daily as needed (shortness of breath).      Allergies:    Patient has no known allergies.   Social History: Social History   Socioeconomic History   Marital status: Married    Spouse name: Not on file   Number of children: Not on file   Years of education: Not on file   Highest education level: Not on file  Occupational History   Occupation: Tree surgeon  Tobacco Use   Smoking status: Former    Years: 30.00    Types: Cigarettes    Quit date: 03/23/1993    Years since  quitting: 27.7   Smokeless tobacco: Never  Substance and Sexual Activity   Alcohol use: Yes    Alcohol/week: 2.0 standard drinks    Types: 2 Glasses of wine per week   Drug use: No   Sexual activity: Not on file  Other Topics Concern   Not on file  Social History Narrative   Not on file   Social Determinants of Health   Financial Resource Strain: Not on file  Food Insecurity: Not on file  Transportation Needs: Not on file  Physical Activity: Not on file  Stress: Not on file  Social Connections: Not on file     Family History: The patient's family history includes Heart attack in his father.  ROS:   All other ROS reviewed and negative. Pertinent positives noted in the HPI.      EKGs/Labs/Other Studies Reviewed:   The following studies were personally reviewed by me today:  TTE 02/02/2020  1. LVEF is approximately 50% with hypokinesis of the distal anterior,  distal lateral and apical walls.   2. Right ventricular systolic function is normal. The right ventricular  size is normal.   3. Trivial mitral valve regurgitation.  LHC 01/22/2020  Severe single-vessel disease with 100% CTO of the LAD just after 2 diagonal branches (1st & 2nd diag) and 1st Sep branch; otherwise no significant CAD. Successful CTO PCI of LAD with antegrade wiring.  Somewhat difficult procedure with difficult wiring, stepwise balloon angioplasty and 2 overlapping stents required. Brief interval of hypotension just prior to stent deployment, responded to short term IV Levophed + 250 mL NS bolus.  Recent Labs: 01/23/2020: BUN 11; Creatinine, Ser 1.15; Hemoglobin 15.1; Platelets 261; Potassium 3.7; Sodium 138 05/10/2020: ALT 33   Recent Lipid Panel    Component Value Date/Time   CHOL 132 05/10/2020 0845   TRIG 62 05/10/2020 0845   HDL 61 05/10/2020 0845   CHOLHDL 2.2 05/10/2020 0845   LDLCALC 58 05/10/2020 0845    Physical Exam:   VS:  BP 116/78   Pulse 76   Ht 5\' 9"  (1.753 m)   Wt 167 lb 12.8 oz (76.1 kg)   SpO2 96%   BMI 24.78 kg/m    Wt Readings from Last 3 Encounters:  12/17/20 167 lb 12.8 oz (76.1 kg)  05/14/20 153 lb 9.6 oz (69.7 kg)  02/05/20 161 lb (73 kg)    General: Well nourished, well developed, in no acute distress Head: Atraumatic, normal size  Eyes: PEERLA, EOMI  Neck: Supple, no JVD Endocrine: No thryomegaly Cardiac: Normal S1, S2; RRR; no murmurs, rubs, or gallops Lungs: Clear to auscultation bilaterally, no wheezing, rhonchi or rales  Abd: Soft, nontender, no hepatomegaly  Ext: No edema, pulses 2+ Musculoskeletal: No deformities, BUE and BLE strength normal and equal Skin: Warm and dry, no rashes   Neuro: Alert and oriented to person, place, time, and  situation, CNII-XII grossly intact, no focal deficits  Psych: Normal mood and affect   ASSESSMENT:   Marcus Hopkins is a 80 y.o. male who presents for the following: 1. Coronary artery disease involving native coronary artery of native heart without angina pectoris   2. Mixed hyperlipidemia     PLAN:   1. Coronary artery disease involving native coronary artery of native heart without angina pectoris 2. Mixed hyperlipidemia -Underwent PCI on 01/22/2020 due to CTO of the proximal LAD.  The lesion was easily crossed.  He does have severe distal LAD disease but no symptoms of angina.  He has good collateral flow to the distal LAD and I suspect this is the reason he has no angina. -EF 50% with regional wall motion normalities the distal LAD.  No thrombus or issues there. -He has completed 6 months of DAPT.  He will continue aspirin 81 mg indefinitely.  He is on Crestor 20 mg daily.  Most recent LDL 58.  All of his numbers are at goal.  BP 116/78 which is also at goal no medication.  He reports no use of nitroglycerin.  He can walk 2 to 4 miles without any limitations and no symptoms of chest pain or shortness of breath.  We will continue with aggressive medical management but all of his numbers are at goal.  He will see [place] yearly.     Disposition: Return in about 1 year (around 12/17/2021).  Medication Adjustments/Labs and Tests Ordered: Current medicines are reviewed at length with the patient today.  Concerns regarding medicines are outlined above.  No orders of the defined types were placed in this encounter.  No orders of the defined types were placed in this encounter.   Patient Instructions  Medication Instructions:  The current medical regimen is effective;  continue present plan and medications.  *If you need a refill on your cardiac medications before your next appointment, please call your pharmacy*   Follow-Up: At [facility], you and your health needs are our priority.   As part of our continuing mission to provide you with exceptional heart care, we have created designated Provider Care Teams.  These Care Teams include your primary Cardiologist (physician) and Advanced Practice Providers (APPs -  Physician Assistants and Nurse Practitioners) who all work together to provide you with the care you need, when you need it.  We recommend signing up for the patient portal called "MyChart".  Sign up information is provided on this After Visit Summary.  MyChart is used to connect with patients for Virtual Visits (Telemedicine).  Patients are able to view lab/test results, encounter notes, upcoming appointments, etc.  Non-urgent messages can be sent to your provider as well.   To learn more about what you can do with MyChart, go to [email or web address].    Your next appointment:   12 month(s)  The format for your next appointment:   In Person  Provider:   You may see Evalina Field, MD or one of the following Advanced Practice Providers on your designated Care Team:   Almyra Deforest, PA-C Sande Rives, [place]    Time Spent with Patient: I have spent a total of 25 minutes with patient reviewing hospital notes, telemetry, EKGs, labs and examining the patient as well as establishing an assessment and plan that was discussed with the patient.  > 50% of time was spent in direct patient care.  Signed, Addison Naegeli. Audie Box, MD, [place]  [address] [place], [place] [address] [phone]  12/17/2020 4:44 PM

## 2020-12-17 NOTE — Patient Instructions (Signed)
Medication Instructions:  The current medical regimen is effective;  continue present plan and medications.  *If you need a refill on your cardiac medications before your next appointment, please call your pharmacy*   Follow-Up: At [facility], you and your health needs are our priority.  As part of our continuing mission to provide you with exceptional heart care, we have created designated Provider Care Teams.  These Care Teams include your primary Cardiologist (physician) and Advanced Practice Providers (APPs -  Physician Assistants and Nurse Practitioners) who all work together to provide you with the care you need, when you need it.  We recommend signing up for the patient portal called "MyChart".  Sign up information is provided on this After Visit Summary.  MyChart is used to connect with patients for Virtual Visits (Telemedicine).  Patients are able to view lab/test results, encounter notes, upcoming appointments, etc.  Non-urgent messages can be sent to your provider as well.   To learn more about what you can do with MyChart, go to [email or web address].    Your next appointment:   12 month(s)  The format for your next appointment:   In Person  Provider:   You may see Evalina Field, MD or one of the following Advanced Practice Providers on your designated Care Team:   Almyra Deforest, PA-C Sande Rives, [place]

## 2021-01-01 ENCOUNTER — Other Ambulatory Visit: Payer: Self-pay | Admitting: Cardiovascular Disease

## 2021-01-21 DIAGNOSIS — U071 COVID-19: Secondary | ICD-10-CM | POA: Diagnosis not present

## 2021-02-25 DIAGNOSIS — E7800 Pure hypercholesterolemia, unspecified: Secondary | ICD-10-CM | POA: Diagnosis not present

## 2021-02-25 DIAGNOSIS — I2510 Atherosclerotic heart disease of native coronary artery without angina pectoris: Secondary | ICD-10-CM | POA: Diagnosis not present

## 2021-02-25 DIAGNOSIS — Z1389 Encounter for screening for other disorder: Secondary | ICD-10-CM | POA: Diagnosis not present

## 2021-02-25 DIAGNOSIS — Z0000 Encounter for general adult medical examination without abnormal findings: Secondary | ICD-10-CM | POA: Diagnosis not present

## 2021-03-10 ENCOUNTER — Other Ambulatory Visit: Payer: Self-pay

## 2021-03-10 ENCOUNTER — Ambulatory Visit
Admission: RE | Admit: 2021-03-10 | Discharge: 2021-03-10 | Disposition: A | Discharge status: Home / Self Care | Payer: Medicare HMO | Source: Ambulatory Visit | Attending: Allergy and Immunology | Admitting: Allergy and Immunology

## 2021-03-10 ENCOUNTER — Other Ambulatory Visit: Payer: Self-pay | Admitting: Allergy and Immunology

## 2021-03-10 DIAGNOSIS — J209 Acute bronchitis, unspecified: Secondary | ICD-10-CM

## 2021-03-10 DIAGNOSIS — R062 Wheezing: Secondary | ICD-10-CM | POA: Diagnosis not present

## 2021-03-10 DIAGNOSIS — T781XXD Other adverse food reactions, not elsewhere classified, subsequent encounter: Secondary | ICD-10-CM | POA: Diagnosis not present

## 2021-03-10 DIAGNOSIS — J3089 Other allergic rhinitis: Secondary | ICD-10-CM | POA: Diagnosis not present

## 2021-03-10 DIAGNOSIS — J45909 Unspecified asthma, uncomplicated: Secondary | ICD-10-CM | POA: Diagnosis not present

## 2021-03-10 DIAGNOSIS — J301 Allergic rhinitis due to pollen: Secondary | ICD-10-CM | POA: Diagnosis not present

## 2021-03-12 ENCOUNTER — Other Ambulatory Visit: Payer: Self-pay | Admitting: Allergy and Immunology

## 2021-03-12 ENCOUNTER — Ambulatory Visit
Admission: RE | Admit: 2021-03-12 | Discharge: 2021-03-12 | Disposition: A | Discharge status: Home / Self Care | Payer: Medicare HMO | Source: Ambulatory Visit | Attending: Allergy and Immunology | Admitting: Allergy and Immunology

## 2021-03-12 DIAGNOSIS — J209 Acute bronchitis, unspecified: Secondary | ICD-10-CM

## 2021-03-12 DIAGNOSIS — R911 Solitary pulmonary nodule: Secondary | ICD-10-CM | POA: Diagnosis not present

## 2021-03-12 DIAGNOSIS — I700 Atherosclerosis of aorta: Secondary | ICD-10-CM | POA: Diagnosis not present

## 2021-03-19 ENCOUNTER — Telehealth: Payer: Self-pay | Admitting: Cardiovascular Disease

## 2021-03-19 MED ORDER — PANTOPRAZOLE SODIUM 40 MG PO TBEC: 40.0000 mg | DELAYED_RELEASE_TABLET | Freq: Every day | ORAL | 0 refills | Status: DC

## 2021-03-19 NOTE — Telephone Encounter (Signed)
Returned call to pt he states that when he had his stent and he was taking Omeprazole at that time(he states that this is for having a hard time swallowing), at discharge they told him to switch to pantoprazole he was taking it intermittently then states that he no longer needed it. Now he wants to make sure that it is OK to restart he is having trouble swallowing again. Also, wants to make sure there are no interactions with his medications

## 2021-03-19 NOTE — Telephone Encounter (Signed)
Pt informed of providers result & recommendations. Pt verbalized understanding. No further questions . Pt would like an email "to remember what to tell PCP. Short refill sent Email sent as requested

## 2021-03-19 NOTE — Telephone Encounter (Signed)
Patient should not take omeprazole due to drug interactions.  Ok to restart pantoprazole however recommend following up with PCP

## 2021-03-19 NOTE — Telephone Encounter (Signed)
Called patient back, advised that Dr.O'Neal was in the hospital doing procedures the last two days and I would send a message back to let him know per request to review the testing. Also advised I did not receive a mychart message- but we could message him on mychart or I could call him back.  Gave call back number if questions/concerns.

## 2021-03-19 NOTE — Telephone Encounter (Signed)
Patient was hoping to have Dr. Audie Box go over his CT done 03/12/21 and then give his recommendations. He initially was just scheduled for an X-Ray but then was told to come back the next day for another test.  He tired to reach out to the office via [place]  but is not sure if his message went through or not

## 2021-03-19 NOTE — Telephone Encounter (Signed)
Pt informed Dr Marisue Ivan is on vacation. Verbalized understanding.

## 2021-03-19 NOTE — Telephone Encounter (Signed)
Pt c/o medication issue:  1. Name of Medication: pantoprazole (PROTONIX) 40 MG tablet  2. How are you currently taking this medication (dosage and times per day)? Patient not taking  3. Are you having a reaction (difficulty breathing--STAT)?   4. What is your medication issue? Patient wanted to know if he should start taking this again. Please advise

## 2021-03-20 NOTE — Telephone Encounter (Signed)
Pt informed of providers result & recommendations. Pt verbalized understanding. No further questions . ? ?

## 2021-04-08 DIAGNOSIS — R052 Subacute cough: Secondary | ICD-10-CM | POA: Diagnosis not present

## 2021-06-16 ENCOUNTER — Other Ambulatory Visit: Payer: Self-pay

## 2021-06-16 ENCOUNTER — Other Ambulatory Visit: Payer: Self-pay | Admitting: Cardiovascular Disease

## 2021-06-16 ENCOUNTER — Encounter (HOSPITAL_BASED_OUTPATIENT_CLINIC_OR_DEPARTMENT_OTHER): Payer: Self-pay | Admitting: Family

## 2021-06-16 ENCOUNTER — Ambulatory Visit (HOSPITAL_BASED_OUTPATIENT_CLINIC_OR_DEPARTMENT_OTHER): Payer: Medicare HMO | Admitting: Family

## 2021-06-16 VITALS — BP 122/74 | HR 62 | Ht 69.0 in | Wt 160.0 lb

## 2021-06-16 DIAGNOSIS — I25118 Atherosclerotic heart disease of native coronary artery with other forms of angina pectoris: Secondary | ICD-10-CM

## 2021-06-16 DIAGNOSIS — E785 Hyperlipidemia, unspecified: Secondary | ICD-10-CM

## 2021-06-16 MED ORDER — METOPROLOL SUCCINATE ER 25 MG PO TB24: 12.5000 mg | ORAL_TABLET | Freq: Every day | ORAL | 3 refills | Status: DC

## 2021-06-16 MED ORDER — NITROGLYCERIN 0.4 MG SL SUBL: 0.4000 mg | SUBLINGUAL_TABLET | SUBLINGUAL | 4 refills | Status: DC | PRN

## 2021-06-16 NOTE — Progress Notes (Signed)
? ?Office Visit  ?  ?Patient Name: Marcus Hopkins ?Date of Encounter: 06/16/2021 ? ?PCP:  Lavone Orn, MD ?  ?[place]  ?Cardiologist:  Evalina Field, MD  ?Advanced Practice Provider:  No care team member to display ?Electrophysiologist:  None  ?   ? ?Chief Complaint  ?  ?Marcus Hopkins is a 81 y.o. male with a hx of  CAD s/p PCI (PCI to proximal LAD in setting of CTO 01/2020), hyperlipidemia presents today for chest pain ? ?Past Medical History  ?  ?Past Medical History:  ?Diagnosis Date  ? Allergic rhinitis   ? Borderline hyperlipidemia   ? Cataracts, bilateral   ? Hemorrhoids   ? Insomnia   ? ?Past Surgical History:  ?Procedure Laterality Date  ? CHOLECYSTECTOMY    ? CORONARY CTO INTERVENTION N/A 01/22/2020  ? Procedure: CORONARY CTO INTERVENTION;  Surgeon: Leonie Man, MD;  Location: [facility];  Service: Cardiovascular;  Laterality: N/A;  ? ESOPHAGOGASTRODUODENOSCOPY  11/29/2011  ? Procedure: ESOPHAGOGASTRODUODENOSCOPY (EGD);  Surgeon: Arta Silence, MD;  Location: [facility];  Service: Endoscopy;  Laterality: N/A;  ? LEFT HEART CATH AND CORONARY ANGIOGRAPHY N/A 01/22/2020  ? Procedure: LEFT HEART CATH AND CORONARY ANGIOGRAPHY;  Surgeon: Leonie Man, MD;  Location: [facility];  Service: Cardiovascular;  Laterality: N/A;  ? ROTATOR CUFF REPAIR    ? ? ?Allergies ? ?No Known Allergies ? ?History of Present Illness  ?  ?Marcus Hopkins is a 81 y.o. male with a hx of CAD s/p PCI (PCI to proximal LAD in setting of CTO 01/2020), hyperlipidemia last seen 12/17/2020. ? ?He was last seen in clinic 12/17/2020.  He had completed 64-monthcourse of DAPT and LDL was 58 which is at goal of less than 70.  He was doing well from a cardiac perspective and recommended to follow-up in a year. ? ?He presents today for follow-up independently. Shares with me that prior to his stenting in November his anginal equivalent was some left upper chest discomfort. He enjoys waking in  his spare time at [employer] Tells me this week he was walking and had some chest discomfort that was more mid-sternal to right-sided different than her anginal equivalent but resolved with rest.. Yesterday tried to walk again and had some recurrent chest discomfort after only 200 yards. Feels like he can't catch his breath when this occurs. No dyspnea nor chest pain with usual activity.  He has not taken nitroglycerin. ? ?EKGs/Labs/Other Studies Reviewed:  ? ?The following studies were reviewed today: ? ?TTE 02/02/2020 ? 1. LVEF is approximately 50% with hypokinesis of the distal anterior,  ?distal lateral and apical walls.  ? 2. Right ventricular systolic function is normal. The right ventricular  ?size is normal.  ? 3. Trivial mitral valve regurgitation. ?  ?LHC 01/22/2020 ?  ?Severe single-vessel disease with 100% CTO of the LAD just after 2 diagonal branches (1st & 2nd diag) and 1st Sep branch; otherwise no significant CAD. ?Successful CTO PCI of LAD with antegrade wiring.  Somewhat difficult procedure with difficult wiring, stepwise balloon angioplasty and 2 overlapping stents required. ?Brief interval of hypotension just prior to stent deployment, responded to short term IV Levophed + 250 mL NS bolus. ? ? ?Diagnostic ?Dominance: Right ?Intervention ? ? ? ? ?EKG:  EKG is ordered today.  The ekg ordered today demonstrates NSR 62 bpm with first-degree AV block and stable T wave inversion in lead V4, V5. ? ?  Recent Labs: ?No results found for requested labs within last 8760 hours.  ?Recent Lipid Panel ?   ?Component Value Date/Time  ? CHOL 132 05/10/2020 0845  ? TRIG 62 05/10/2020 0845  ? HDL 61 05/10/2020 0845  ? CHOLHDL 2.2 05/10/2020 0845  ? [place] 58 05/10/2020 0845  ? ? ?Home Medications  ? ?Current Meds  ?Medication Sig  ? acetaminophen (TYLENOL) 500 MG tablet Take 1,000 mg by mouth every 6 (six) hours as needed for moderate pain or headache.  ? aspirin EC 81 MG tablet Take 1 tablet (81 mg total) by  mouth daily. Swallow whole.  ? Cholecalciferol (VITAMIN D-3) 25 MCG (1000 UT) CAPS Take 1,000 Units by mouth daily.  ? metoprolol succinate (TOPROL XL) 25 MG 24 hr tablet Take 0.5 tablets (12.5 mg total) by mouth daily.  ? rosuvastatin (CRESTOR) 20 MG tablet TAKE ONE TABLET BY MOUTH DAILY  ? TRELEGY ELLIPTA 100-62.5-25 MCG/ACT AEPB Inhale 1 puff into the lungs daily.  ?  ? ?Review of Systems  ?    ?All other systems reviewed and are otherwise negative except as noted above. ? ?Physical Exam  ?  ?VS:  BP 122/74   Pulse 62   Ht '5\' 9"'$  (1.753 m)   Wt 160 lb (72.6 kg)   SpO2 96%   BMI 23.63 kg/m?  , BMI Body mass index is 23.63 kg/m?. ? ?Wt Readings from Last 3 Encounters:  ?06/16/21 160 lb (72.6 kg)  ?12/17/20 167 lb 12.8 oz (76.1 kg)  ?05/14/20 153 lb 9.6 oz (69.7 kg)  ?  ? ?GEN: Well nourished, well developed, in no acute distress. ?HEENT: normal. ?Neck: Supple, no JVD, carotid bruits, or masses. ?Cardiac: RRR, no murmurs, rubs, or gallops. No clubbing, cyanosis, edema.  Radials/PT 2+ and equal bilaterally.  ?Respiratory:  Respirations regular and unlabored, clear to auscultation bilaterally. ?GI: Soft, nontender, nondistended. ?MS: No deformity or atrophy. ?Skin: Warm and dry, no rash. ?Neuro:  Strength and sensation are intact. ?Psych: Normal affect. ? ?Assessment & Plan  ?  ?CAD -underwent PCI 01/22/2020 due to CTO of proximal LAD.  He does have severe distal LAD disease with no symptoms of angina at previous clinic visit with good collateral flow,  LVEF 50%.  Presents today with 1 week history of occasional right-sided chest discomfort with exertion.  This is different than his anginal equivalent of left-sided chest discomfort though does still resolve with rest.  We will add metoprolol succinate 12.5 mg daily for antianginal benefit given his known severe distal disease.  Careful monitoring for hypotension.  EKG today without acute ST/T wave changes.  Prompt follow-up in 1 to 2 weeks and if chest pain not  improved consider Lexiscan Myoview.  Educated on PRN nitroglycerin and when to present to emergency department. Plan reviewed with Dr. Harrell Gave in clinic today.  ? ?HLD, LDL goal less than 70 - 02/2021 LDL 72.  Discussed impact lipid-lowering diet and regular cardiovascular exercise would have.  Continue rosuvastatin 20 mg daily.  Consider repeat lipid panel in approximately 3 months to reassess and if LDL not at goal consider increased dose versus addition of Zetia. ? ?Disposition: Follow up in 1-2 week(s) with Evalina Field, MD or APP. ? ?Signed, ?Loel Dubonnet, NP ?06/16/2021, 4:47 PM ?[place] ?

## 2021-06-16 NOTE — Patient Instructions (Signed)
Medication Instructions:  ?Your physician has recommended you make the following change in your medication:  ? ?START Metoprolol Succinate half tablet (12.'5mg'$  ) daily ? ?We have sent a refill of your as-needed nitroglycerin ? ?For as needed Nitroglycerin, if you develop chest pain: ?Sit and rest 5 minutes. If chest pain does not resolve place 1 nitroglycerin under your tongue and wait 5 minutes. ?If chest pain does not resolve, place a 2nd nitroglycerin under your tongue and wait 5 more minutes. ?If chest pain does not resolve, place a 3rd nitroglycerin under your tongue and seek emergency services.  ? ?*If you need a refill on your cardiac medications before your next appointment, please call your pharmacy* ? ? ?Lab Work: ?None ordered today.  ? ?Testing/Procedures: ?Your EKG today showed normal sinus rhythm which is a good result.  ? ? ?Follow-Up: ?At [facility], you and your health needs are our priority.  As part of our continuing mission to provide you with exceptional heart care, we have created designated Provider Care Teams.  These Care Teams include your primary Cardiologist (physician) and Advanced Practice Providers (APPs -  Physician Assistants and Nurse Practitioners) who all work together to provide you with the care you need, when you need it. ? ?We recommend signing up for the patient portal called "MyChart".  Sign up information is provided on this After Visit Summary.  MyChart is used to connect with patients for Virtual Visits (Telemedicine).  Patients are able to view lab/test results, encounter notes, upcoming appointments, etc.  Non-urgent messages can be sent to your provider as well.   ?To learn more about what you can do with MyChart, go to [email or web address].   ? ?Your next appointment:   ?1-2 week(s) ? ?The format for your next appointment:   ?In Person ? ?Provider:   ?Evalina Field, MD or Loel Dubonnet, NP   ? ? ?Other Instructions ? ?Heart Healthy Diet Recommendations: ?A  low-salt diet is recommended. Meats should be grilled, baked, or boiled. Avoid fried foods. Focus on lean protein sources like fish or chicken with vegetables and fruits. The American Heart Association is a Microbiologist!  American Heart Association Diet and Lifeystyle Recommendations   ? ?Exercise recommendations: ?The American Heart Association recommends 150 minutes of moderate intensity exercise weekly. ?Try 30 minutes of moderate intensity exercise 4-5 times per week. ?This could include walking, jogging, or swimming. ? ?For coronary artery disease often called "heart disease" we aim for optimal guideline directed medical therapy. We use the "A, B, C"s to help keep [place] on track! ? ?A = Aspirin '81mg'$  daily ?B = Beta blocker which helps to relax the heart. This is your Metoprolol. ?C = Cholesterol control. You take Rosuvastatin to help control your cholesterol.  ?D = Don't forget nitroglycerin! This is an emergency tablet to be used if you have chest pain. ? ?

## 2021-06-22 NOTE — Progress Notes (Signed)
? ?Office Visit  ?  ?Patient Name: Marcus Hopkins ?Date of Encounter: 06/23/2021 ? ?PCP:  Lavone Orn, MD ?  ?[place]  ?Cardiologist:  Evalina Field, MD  ?Advanced Practice Provider:  No care team member to display ?Electrophysiologist:  None  ?   ? ?Chief Complaint  ?  ?Marcus Hopkins is a 81 y.o. male with a hx of  CAD s/p PCI (PCI to proximal LAD in setting of CTO 01/2020), hyperlipidemia presents today for chest pain ? ?Past Medical History  ?  ?Past Medical History:  ?Diagnosis Date  ? Allergic rhinitis   ? Borderline hyperlipidemia   ? Cataracts, bilateral   ? Hemorrhoids   ? Insomnia   ? ?Past Surgical History:  ?Procedure Laterality Date  ? CHOLECYSTECTOMY    ? CORONARY CTO INTERVENTION N/A 01/22/2020  ? Procedure: CORONARY CTO INTERVENTION;  Surgeon: Leonie Man, MD;  Location: [facility];  Service: Cardiovascular;  Laterality: N/A;  ? ESOPHAGOGASTRODUODENOSCOPY  11/29/2011  ? Procedure: ESOPHAGOGASTRODUODENOSCOPY (EGD);  Surgeon: Arta Silence, MD;  Location: [facility];  Service: Endoscopy;  Laterality: N/A;  ? LEFT HEART CATH AND CORONARY ANGIOGRAPHY N/A 01/22/2020  ? Procedure: LEFT HEART CATH AND CORONARY ANGIOGRAPHY;  Surgeon: Leonie Man, MD;  Location: [facility];  Service: Cardiovascular;  Laterality: N/A;  ? ROTATOR CUFF REPAIR    ? ? ?Allergies ? ?No Known Allergies ? ?History of Present Illness  ?  ?Marcus Hopkins is a 81 y.o. male with a hx of CAD s/p PCI (PCI to proximal LAD in setting of CTO 01/2020), hyperlipidemia last seen 06/16/2020. ? ?He was last seen in clinic 12/17/2020.  He had completed 60-monthcourse of DAPT and LDL was 58 which is at goal of less than 70.  He was doing well from a cardiac perspective and recommended to follow-up in a year. ? ?Seen 05/2025 noting some right sided chest discomfort with ambulation that was different than anginal equivalent. Metoprolol succinate 12.'5mg'$  QD was added due to known distal LAD  disease. ? ?He presents today for follow up with his wife. He notes since last seen one week ago he has had to take nitroglycerin 6 times. Chest pain resolves with nitroglycerin. It is right sided and occurs most often with exertion. He notes no change since addition of Metoprolol. No edema, orthopnea, PND.  ? ?EKGs/Labs/Other Studies Reviewed:  ? ?The following studies were reviewed today: ? ?TTE 02/02/2020 ? 1. LVEF is approximately 50% with hypokinesis of the distal anterior,  ?distal lateral and apical walls.  ? 2. Right ventricular systolic function is normal. The right ventricular  ?size is normal.  ? 3. Trivial mitral valve regurgitation. ?  ?LHC 01/22/2020 ?  ?Severe single-vessel disease with 100% CTO of the LAD just after 2 diagonal branches (1st & 2nd diag) and 1st Sep branch; otherwise no significant CAD. ?Successful CTO PCI of LAD with antegrade wiring.  Somewhat difficult procedure with difficult wiring, stepwise balloon angioplasty and 2 overlapping stents required. ?Brief interval of hypotension just prior to stent deployment, responded to short term IV Levophed + 250 mL NS bolus. ? ? ?Diagnostic ?Dominance: Right ?Intervention ? ? ? ? ?EKG:  EKG is not ordered today.  The ekg independently reviewed from 06/16/21 demonstrated NSR 62 bpm with first-degree AV block and stable T wave inversion in lead V4, V5 ? ?Recent Labs: ?No results found for requested labs within last 8760 hours.  ?Recent Lipid  Panel ?   ?Component Value Date/Time  ? CHOL 132 05/10/2020 0845  ? TRIG 62 05/10/2020 0845  ? HDL 61 05/10/2020 0845  ? CHOLHDL 2.2 05/10/2020 0845  ? [place] 58 05/10/2020 0845  ? ? ?Home Medications  ? ?Current Meds  ?Medication Sig  ? acetaminophen (TYLENOL) 500 MG tablet Take 1,000 mg by mouth every 6 (six) hours as needed for moderate pain or headache.  ? aspirin EC 81 MG tablet Take 1 tablet (81 mg total) by mouth daily. Swallow whole.  ? Cholecalciferol (VITAMIN D-3) 25 MCG (1000 UT) CAPS Take 1,000  Units by mouth daily.  ? metoprolol succinate (TOPROL XL) 25 MG 24 hr tablet Take 0.5 tablets (12.5 mg total) by mouth daily.  ? nitroGLYCERIN (NITROSTAT) 0.4 MG SL tablet Place 1 tablet (0.4 mg total) under the tongue every 5 (five) minutes as needed for chest pain.  ? pantoprazole (PROTONIX) 40 MG tablet Take 1 tablet (40 mg total) by mouth daily.  ? rosuvastatin (CRESTOR) 20 MG tablet TAKE ONE TABLET BY MOUTH DAILY  ? TRELEGY ELLIPTA 100-62.5-25 MCG/ACT AEPB Inhale 1 puff into the lungs daily.  ?  ? ?Review of Systems  ?    ?All other systems reviewed and are otherwise negative except as noted above. ? ?Physical Exam  ?  ?VS:  BP 116/74 (BP Location: Right Arm, Patient Position: Sitting, Cuff Size: Normal)   Pulse 61   Ht '5\' 9"'$  (1.753 m)   Wt 162 lb (73.5 kg)   SpO2 94%   BMI 23.92 kg/m?  , BMI Body mass index is 23.92 kg/m?. ? ?Wt Readings from Last 3 Encounters:  ?06/23/21 162 lb (73.5 kg)  ?06/16/21 160 lb (72.6 kg)  ?12/17/20 167 lb 12.8 oz (76.1 kg)  ?  ?GEN: Well nourished, well developed, in no acute distress. ?HEENT: normal. ?Neck: Supple, no JVD, carotid bruits, or masses. ?Cardiac: RRR, no murmurs, rubs, or gallops. No clubbing, cyanosis, edema.  Radials/PT 2+ and equal bilaterally.  ?Respiratory:  Respirations regular and unlabored, clear to auscultation bilaterally. ?GI: Soft, nontender, nondistended. ?MS: No deformity or atrophy. ?Skin: Warm and dry, no rash. ?Neuro:  Strength and sensation are intact. ?Psych: Normal affect. ? ?Assessment & Plan  ?  ?CAD -underwent PCI 01/22/2020 due to CTO of proximal LAD.  He does have severe distal LAD disease with no symptoms of angina at previous clinic visit with good collateral flow,  LVEF 50%.  Seen 1 week ago with right sided chest discomfort which was exertional. No change since Toprol 12.'5mg'$  QD initiated and required nitroglycerin 6 times this week. Also notes fatigue. Discussed Lexiscan myoview vs cardiac catheterization and we agreed to proceed with  cardiac catheterization given the frequency of his symptoms. GDMT includes aspirin, Metoprolol, Rosuvastatin.  ? ?Shared Decision Making/Informed Consent ?The risks [stroke (1 in 1000), death (1 in 47), kidney failure [usually temporary] (1 in 500), bleeding (1 in 200), allergic reaction [possibly serious] (1 in 200)], benefits (diagnostic support and management of coronary artery disease) and alternatives of a cardiac catheterization were discussed in detail with Mr. Bolz and he is willing to proceed.  ? ?HLD, LDL goal less than 70 - 02/2021 LDL 72.  Discussed impact lipid-lowering diet and regular cardiovascular exercise would have.  Continue rosuvastatin 20 mg daily.  Updated direct LDL. If LDL not at goal of <70, add Zetia.  ? ?Disposition: Follow up in 4 weeks with Evalina Field, MD or APP. ? ?Signed, ?Loel Dubonnet, NP ?  06/23/2021, 2:11 PM ?[place] ?

## 2021-06-22 NOTE — H&P (View-Only) (Signed)
? ?Office Visit  ?  ?Patient Name: Marcus Hopkins ?Date of Encounter: 06/23/2021 ? ?PCP:  Lavone Orn, MD ?  ?[place]  ?Cardiologist:  Evalina Field, MD  ?Advanced Practice Provider:  No care team member to display ?Electrophysiologist:  None  ?   ? ?Chief Complaint  ?  ?Marcus Hopkins is a 81 y.o. male with a hx of  CAD s/p PCI (PCI to proximal LAD in setting of CTO 01/2020), hyperlipidemia presents today for chest pain ? ?Past Medical History  ?  ?Past Medical History:  ?Diagnosis Date  ? Allergic rhinitis   ? Borderline hyperlipidemia   ? Cataracts, bilateral   ? Hemorrhoids   ? Insomnia   ? ?Past Surgical History:  ?Procedure Laterality Date  ? CHOLECYSTECTOMY    ? CORONARY CTO INTERVENTION N/A 01/22/2020  ? Procedure: CORONARY CTO INTERVENTION;  Surgeon: Leonie Man, MD;  Location: [facility];  Service: Cardiovascular;  Laterality: N/A;  ? ESOPHAGOGASTRODUODENOSCOPY  11/29/2011  ? Procedure: ESOPHAGOGASTRODUODENOSCOPY (EGD);  Surgeon: Arta Silence, MD;  Location: [facility];  Service: Endoscopy;  Laterality: N/A;  ? LEFT HEART CATH AND CORONARY ANGIOGRAPHY N/A 01/22/2020  ? Procedure: LEFT HEART CATH AND CORONARY ANGIOGRAPHY;  Surgeon: Leonie Man, MD;  Location: [facility];  Service: Cardiovascular;  Laterality: N/A;  ? ROTATOR CUFF REPAIR    ? ? ?Allergies ? ?No Known Allergies ? ?History of Present Illness  ?  ?Marcus Hopkins is a 81 y.o. male with a hx of CAD s/p PCI (PCI to proximal LAD in setting of CTO 01/2020), hyperlipidemia last seen 06/16/2020. ? ?He was last seen in clinic 12/17/2020.  He had completed 19-monthcourse of DAPT and LDL was 58 which is at goal of less than 70.  He was doing well from a cardiac perspective and recommended to follow-up in a year. ? ?Seen 05/2025 noting some right sided chest discomfort with ambulation that was different than anginal equivalent. Metoprolol succinate 12.'5mg'$  QD was added due to known distal LAD  disease. ? ?He presents today for follow up with his wife. He notes since last seen one week ago he has had to take nitroglycerin 6 times. Chest pain resolves with nitroglycerin. It is right sided and occurs most often with exertion. He notes no change since addition of Metoprolol. No edema, orthopnea, PND.  ? ?EKGs/Labs/Other Studies Reviewed:  ? ?The following studies were reviewed today: ? ?TTE 02/02/2020 ? 1. LVEF is approximately 50% with hypokinesis of the distal anterior,  ?distal lateral and apical walls.  ? 2. Right ventricular systolic function is normal. The right ventricular  ?size is normal.  ? 3. Trivial mitral valve regurgitation. ?  ?LHC 01/22/2020 ?  ?Severe single-vessel disease with 100% CTO of the LAD just after 2 diagonal branches (1st & 2nd diag) and 1st Sep branch; otherwise no significant CAD. ?Successful CTO PCI of LAD with antegrade wiring.  Somewhat difficult procedure with difficult wiring, stepwise balloon angioplasty and 2 overlapping stents required. ?Brief interval of hypotension just prior to stent deployment, responded to short term IV Levophed + 250 mL NS bolus. ? ? ?Diagnostic ?Dominance: Right ?Intervention ? ? ? ? ?EKG:  EKG is not ordered today.  The ekg independently reviewed from 06/16/21 demonstrated NSR 62 bpm with first-degree AV block and stable T wave inversion in lead V4, V5 ? ?Recent Labs: ?No results found for requested labs within last 8760 hours.  ?Recent Lipid  Panel ?   ?Component Value Date/Time  ? CHOL 132 05/10/2020 0845  ? TRIG 62 05/10/2020 0845  ? HDL 61 05/10/2020 0845  ? CHOLHDL 2.2 05/10/2020 0845  ? [place] 58 05/10/2020 0845  ? ? ?Home Medications  ? ?Current Meds  ?Medication Sig  ? acetaminophen (TYLENOL) 500 MG tablet Take 1,000 mg by mouth every 6 (six) hours as needed for moderate pain or headache.  ? aspirin EC 81 MG tablet Take 1 tablet (81 mg total) by mouth daily. Swallow whole.  ? Cholecalciferol (VITAMIN D-3) 25 MCG (1000 UT) CAPS Take 1,000  Units by mouth daily.  ? metoprolol succinate (TOPROL XL) 25 MG 24 hr tablet Take 0.5 tablets (12.5 mg total) by mouth daily.  ? nitroGLYCERIN (NITROSTAT) 0.4 MG SL tablet Place 1 tablet (0.4 mg total) under the tongue every 5 (five) minutes as needed for chest pain.  ? pantoprazole (PROTONIX) 40 MG tablet Take 1 tablet (40 mg total) by mouth daily.  ? rosuvastatin (CRESTOR) 20 MG tablet TAKE ONE TABLET BY MOUTH DAILY  ? TRELEGY ELLIPTA 100-62.5-25 MCG/ACT AEPB Inhale 1 puff into the lungs daily.  ?  ? ?Review of Systems  ?    ?All other systems reviewed and are otherwise negative except as noted above. ? ?Physical Exam  ?  ?VS:  BP 116/74 (BP Location: Right Arm, Patient Position: Sitting, Cuff Size: Normal)   Pulse 61   Ht '5\' 9"'$  (1.753 m)   Wt 162 lb (73.5 kg)   SpO2 94%   BMI 23.92 kg/m?  , BMI Body mass index is 23.92 kg/m?. ? ?Wt Readings from Last 3 Encounters:  ?06/23/21 162 lb (73.5 kg)  ?06/16/21 160 lb (72.6 kg)  ?12/17/20 167 lb 12.8 oz (76.1 kg)  ?  ?GEN: Well nourished, well developed, in no acute distress. ?HEENT: normal. ?Neck: Supple, no JVD, carotid bruits, or masses. ?Cardiac: RRR, no murmurs, rubs, or gallops. No clubbing, cyanosis, edema.  Radials/PT 2+ and equal bilaterally.  ?Respiratory:  Respirations regular and unlabored, clear to auscultation bilaterally. ?GI: Soft, nontender, nondistended. ?MS: No deformity or atrophy. ?Skin: Warm and dry, no rash. ?Neuro:  Strength and sensation are intact. ?Psych: Normal affect. ? ?Assessment & Plan  ?  ?CAD -underwent PCI 01/22/2020 due to CTO of proximal LAD.  He does have severe distal LAD disease with no symptoms of angina at previous clinic visit with good collateral flow,  LVEF 50%.  Seen 1 week ago with right sided chest discomfort which was exertional. No change since Toprol 12.'5mg'$  QD initiated and required nitroglycerin 6 times this week. Also notes fatigue. Discussed Lexiscan myoview vs cardiac catheterization and we agreed to proceed with  cardiac catheterization given the frequency of his symptoms. GDMT includes aspirin, Metoprolol, Rosuvastatin.  ? ?Shared Decision Making/Informed Consent ?The risks [stroke (1 in 1000), death (1 in 97), kidney failure [usually temporary] (1 in 500), bleeding (1 in 200), allergic reaction [possibly serious] (1 in 200)], benefits (diagnostic support and management of coronary artery disease) and alternatives of a cardiac catheterization were discussed in detail with Marcus Hopkins and he is willing to proceed.  ? ?HLD, LDL goal less than 70 - 02/2021 LDL 72.  Discussed impact lipid-lowering diet and regular cardiovascular exercise would have.  Continue rosuvastatin 20 mg daily.  Updated direct LDL. If LDL not at goal of <70, add Zetia.  ? ?Disposition: Follow up in 4 weeks with Evalina Field, MD or APP. ? ?Signed, ?Loel Dubonnet, NP ?  06/23/2021, 2:11 PM ?[place] ?

## 2021-06-23 ENCOUNTER — Ambulatory Visit (HOSPITAL_BASED_OUTPATIENT_CLINIC_OR_DEPARTMENT_OTHER): Payer: Medicare HMO | Admitting: Family

## 2021-06-23 ENCOUNTER — Encounter (HOSPITAL_BASED_OUTPATIENT_CLINIC_OR_DEPARTMENT_OTHER): Payer: Self-pay | Admitting: Family

## 2021-06-23 VITALS — BP 116/74 | HR 61 | Ht 69.0 in | Wt 162.0 lb

## 2021-06-23 DIAGNOSIS — I25118 Atherosclerotic heart disease of native coronary artery with other forms of angina pectoris: Secondary | ICD-10-CM | POA: Diagnosis not present

## 2021-06-23 DIAGNOSIS — E785 Hyperlipidemia, unspecified: Secondary | ICD-10-CM

## 2021-06-23 NOTE — Patient Instructions (Addendum)
Medication Instructions:  ?Continue current medications.  ? ?*If you need a refill on your cardiac medications before your next appointment, please call your pharmacy* ? ? ?Lab Work: ?Your physician recommends that you return for lab work today for BMP, CBC, direct LDL ? ?If you have labs (blood work) drawn today and your tests are completely normal, you will receive your results only by: ?MyChart Message (if you have MyChart) OR ?A paper copy in the mail ?If you have any lab test that is abnormal or we need to change your treatment, we will call you to review the results. ? ? ?Testing/Procedures: ?Your provider has recommended a cardiac catheterization. Please see detailed instructions below.  ? ?Follow-Up: ?At [facility], you and your health needs are our priority.  As part of our continuing mission to provide you with exceptional heart care, we have created designated Provider Care Teams.  These Care Teams include your primary Cardiologist (physician) and Advanced Practice Providers (APPs -  Physician Assistants and Nurse Practitioners) who all work together to provide you with the care you need, when you need it. ? ?We recommend signing up for the patient portal called "MyChart".  Sign up information is provided on this After Visit Summary.  MyChart is used to connect with patients for Virtual Visits (Telemedicine).  Patients are able to view lab/test results, encounter notes, upcoming appointments, etc.  Non-urgent messages can be sent to your provider as well.   ?To learn more about what you can do with MyChart, go to [email or web address].   ? ?Your next appointment:   ?4 week(s) ? ?The format for your next appointment:   ?In Person ? ?Provider:   ?Evalina Field, MD or Loel Dubonnet, NP   ? ? ?Other Instructions ? ? ?[place] ?[place] ?[place] [address] ?Dept: [phone] ? ?Toddy F  [place]  06/23/2021 ? ?You are scheduled for a Cardiac Catheterization on Tuesday, April 4 with Dr. [name]. ? ?1. Please arrive at the Main Entrance A at [facility]: [place],  [id number] at 10:00 AM (This time is two hours before your procedure to ensure your preparation). Free valet parking service is available.  ? ?Special note: Every effort is made to have your procedure done on time. Please understand that emergencies sometimes delay scheduled procedures. ? ?2. Diet: Do not eat solid foods after midnight.  You may have clear liquids until 5 AM upon the day of the procedure. ? ?3. Labs: You will need to have blood drawn today. You do not need to be fasting.  ? ?4. Medication instructions in preparation for your procedure: ? ? Contrast Allergy: No ? ?On the morning of your procedure, take Aspirin and any morning medicines NOT listed above.  You may use sips of water. ? ?5. Plan to go home the same day, you will only stay overnight if medically necessary. ?6. You MUST have a responsible adult to drive you home. ?7. An adult MUST be with you the first 24 hours after you arrive home. ?8. Bring a current list of your medications, and the last time and date medication taken. ?9. Bring ID and current insurance cards. ?10.Please wear clothes that are easy to get on and off and wear slip-on shoes. ? ?Thank you for allowing [place] to care for you! ?  -- [facility]   ?

## 2021-06-24 ENCOUNTER — Ambulatory Visit (HOSPITAL_COMMUNITY)
Admission: RE | Admit: 2021-06-24 | Discharge: 2021-06-24 | Disposition: A | Discharge status: Home / Self Care | Payer: Medicare HMO | Attending: Interventional Cardiology | Admitting: Interventional Cardiology

## 2021-06-24 ENCOUNTER — Encounter (HOSPITAL_COMMUNITY)
Admission: RE | Disposition: A | Discharge status: Home / Self Care | Payer: Self-pay | Source: Home / Self Care | Attending: Interventional Cardiology

## 2021-06-24 ENCOUNTER — Other Ambulatory Visit: Payer: Self-pay

## 2021-06-24 ENCOUNTER — Other Ambulatory Visit (HOSPITAL_COMMUNITY): Payer: Self-pay

## 2021-06-24 DIAGNOSIS — Z79899 Other long term (current) drug therapy: Secondary | ICD-10-CM | POA: Diagnosis not present

## 2021-06-24 DIAGNOSIS — I2510 Atherosclerotic heart disease of native coronary artery without angina pectoris: Secondary | ICD-10-CM

## 2021-06-24 DIAGNOSIS — E785 Hyperlipidemia, unspecified: Secondary | ICD-10-CM | POA: Insufficient documentation

## 2021-06-24 DIAGNOSIS — Z7982 Long term (current) use of aspirin: Secondary | ICD-10-CM | POA: Insufficient documentation

## 2021-06-24 DIAGNOSIS — Y832 Surgical operation with anastomosis, bypass or graft as the cause of abnormal reaction of the patient, or of later complication, without mention of misadventure at the time of the procedure: Secondary | ICD-10-CM | POA: Insufficient documentation

## 2021-06-24 DIAGNOSIS — I25110 Atherosclerotic heart disease of native coronary artery with unstable angina pectoris: Secondary | ICD-10-CM | POA: Diagnosis not present

## 2021-06-24 DIAGNOSIS — I200 Unstable angina: Secondary | ICD-10-CM | POA: Diagnosis present

## 2021-06-24 DIAGNOSIS — R5383 Other fatigue: Secondary | ICD-10-CM | POA: Insufficient documentation

## 2021-06-24 DIAGNOSIS — I25118 Atherosclerotic heart disease of native coronary artery with other forms of angina pectoris: Secondary | ICD-10-CM | POA: Diagnosis not present

## 2021-06-24 DIAGNOSIS — T82855A Stenosis of coronary artery stent, initial encounter: Secondary | ICD-10-CM | POA: Diagnosis not present

## 2021-06-24 DIAGNOSIS — Z955 Presence of coronary angioplasty implant and graft: Secondary | ICD-10-CM | POA: Diagnosis not present

## 2021-06-24 HISTORY — PX: INTRAVASCULAR ULTRASOUND/IVUS: CATH118244

## 2021-06-24 HISTORY — PX: CORONARY BALLOON ANGIOPLASTY: CATH118233

## 2021-06-24 HISTORY — PX: LEFT HEART CATH AND CORONARY ANGIOGRAPHY: CATH118249

## 2021-06-24 LAB — BASIC METABOLIC PANEL
BUN/Creatinine Ratio: 11 (ref 10–24)
BUN: 12 mg/dL (ref 8–27)
CO2: 22 mmol/L (ref 20–29)
Calcium: 9.4 mg/dL (ref 8.6–10.2)
Chloride: 102 mmol/L (ref 96–106)
Creatinine, Ser: 1.06 mg/dL (ref 0.76–1.27)
Glucose: 96 mg/dL (ref 70–99)
Potassium: 4.4 mmol/L (ref 3.5–5.2)
Sodium: 138 mmol/L (ref 134–144)
eGFR: 71 mL/min/{1.73_m2} (ref >59)

## 2021-06-24 LAB — POCT ACTIVATED CLOTTING TIME
Activated Clotting Time: 281 seconds
Activated Clotting Time: 407 seconds
Activated Clotting Time: 263 seconds

## 2021-06-24 LAB — CBC
Hematocrit: 46.7 % (ref 37.5–51.0)
Hemoglobin: 15.9 g/dL (ref 13.0–17.7)
MCH: 31.7 pg (ref 26.6–33.0)
MCHC: 34 g/dL (ref 31.5–35.7)
MCV: 93 fL (ref 79–97)
Platelets: 270 10*3/uL (ref 150–450)
RBC: 5.01 x10E6/uL (ref 4.14–5.80)
RDW: 12.4 % (ref 11.6–15.4)
WBC: 7 10*3/uL (ref 3.4–10.8)

## 2021-06-24 LAB — LDL CHOLESTEROL, DIRECT: LDL Direct: 50 mg/dL (ref 0–99)

## 2021-06-24 SURGERY — LEFT HEART CATH AND CORONARY ANGIOGRAPHY
Anesthesia: LOCAL

## 2021-06-24 MED ORDER — HEPARIN SODIUM (PORCINE) 1000 UNIT/ML IJ SOLN
INTRAMUSCULAR | Status: DC | PRN
  Administered 2021-06-24: 3000 [IU] via INTRAVENOUS
  Administered 2021-06-24: 5000 [IU] via INTRAVENOUS
  Administered 2021-06-24: 3500 [IU] via INTRAVENOUS

## 2021-06-24 MED ORDER — ROSUVASTATIN CALCIUM 40 MG PO TABS: 40.0000 mg | ORAL_TABLET | Freq: Every day | ORAL | 1 refills | Status: DC

## 2021-06-24 MED ORDER — TICAGRELOR 90 MG PO TABS: 90.0000 mg | ORAL_TABLET | Freq: Two times a day (BID) | ORAL | Status: DC

## 2021-06-24 MED ORDER — VERAPAMIL HCL 2.5 MG/ML IV SOLN
INTRAVENOUS | Status: DC | PRN
  Administered 2021-06-24: 10 mL via INTRA_ARTERIAL

## 2021-06-24 MED ORDER — SODIUM CHLORIDE 0.9% FLUSH: 3.0000 mL | INTRAVENOUS | Status: DC | PRN

## 2021-06-24 MED ORDER — IOHEXOL 350 MG/ML SOLN
INTRAVENOUS | Status: DC | PRN
  Administered 2021-06-24: 80 mL

## 2021-06-24 MED ORDER — SODIUM CHLORIDE 0.9 % WEIGHT BASED INFUSION
3.0000 mL/kg/h | INTRAVENOUS | Status: AC
  Administered 2021-06-24: 3 mL/kg/h via INTRAVENOUS

## 2021-06-24 MED ORDER — FENTANYL CITRATE (PF) 100 MCG/2ML IJ SOLN
INTRAMUSCULAR | Status: AC
  Filled 2021-06-24: qty 2

## 2021-06-24 MED ORDER — TICAGRELOR 90 MG PO TABS
ORAL_TABLET | ORAL | Status: DC | PRN
  Administered 2021-06-24: 180 mg via ORAL

## 2021-06-24 MED ORDER — LABETALOL HCL 5 MG/ML IV SOLN: 10.0000 mg | INTRAVENOUS | Status: DC | PRN

## 2021-06-24 MED ORDER — ACETAMINOPHEN 325 MG PO TABS: 650.0000 mg | ORAL_TABLET | ORAL | Status: DC | PRN

## 2021-06-24 MED ORDER — CLOPIDOGREL BISULFATE 75 MG PO TABS
75.0000 mg | ORAL_TABLET | Freq: Every day | ORAL | 1 refills | Status: DC
Start: 2021-06-24 — End: 2021-07-24

## 2021-06-24 MED ORDER — LIDOCAINE HCL (PF) 1 % IJ SOLN
INTRAMUSCULAR | Status: DC | PRN
  Administered 2021-06-24: 2 mL

## 2021-06-24 MED ORDER — SODIUM CHLORIDE 0.9% FLUSH: 3.0000 mL | Freq: Two times a day (BID) | INTRAVENOUS | Status: DC

## 2021-06-24 MED ORDER — ASPIRIN 81 MG PO CHEW: 81.0000 mg | CHEWABLE_TABLET | ORAL | Status: DC

## 2021-06-24 MED ORDER — HYDRALAZINE HCL 20 MG/ML IJ SOLN: 10.0000 mg | INTRAMUSCULAR | Status: DC | PRN

## 2021-06-24 MED ORDER — HEPARIN (PORCINE) IN NACL 1000-0.9 UT/500ML-% IV SOLN
INTRAVENOUS | Status: AC
  Filled 2021-06-24: qty 1000

## 2021-06-24 MED ORDER — MIDAZOLAM HCL 2 MG/2ML IJ SOLN
INTRAMUSCULAR | Status: AC
  Filled 2021-06-24: qty 2

## 2021-06-24 MED ORDER — VERAPAMIL HCL 2.5 MG/ML IV SOLN
INTRAVENOUS | Status: DC | PRN
  Administered 2021-06-24: 2 mg via INTRA_ARTERIAL

## 2021-06-24 MED ORDER — NITROGLYCERIN 1 MG/10 ML FOR IR/CATH LAB
INTRA_ARTERIAL | Status: AC
  Filled 2021-06-24: qty 10

## 2021-06-24 MED ORDER — SODIUM CHLORIDE 0.9 % IV SOLN: 250.0000 mL | INTRAVENOUS | Status: DC | PRN

## 2021-06-24 MED ORDER — SODIUM CHLORIDE 0.9 % IV BOLUS
INTRAVENOUS | Status: DC | PRN
  Administered 2021-06-24: 250 mL via INTRAVENOUS

## 2021-06-24 MED ORDER — SODIUM CHLORIDE 0.9 % IV SOLN: INTRAVENOUS | Status: AC

## 2021-06-24 MED ORDER — FENTANYL CITRATE (PF) 100 MCG/2ML IJ SOLN
INTRAMUSCULAR | Status: DC | PRN
  Administered 2021-06-24 (×2): 25 ug via INTRAVENOUS

## 2021-06-24 MED ORDER — NITROGLYCERIN 1 MG/10 ML FOR IR/CATH LAB
INTRA_ARTERIAL | Status: DC | PRN
  Administered 2021-06-24: 200 ug via INTRACORONARY
  Administered 2021-06-24: 400 ug via INTRA_ARTERIAL

## 2021-06-24 MED ORDER — ONDANSETRON HCL 4 MG/2ML IJ SOLN: 4.0000 mg | Freq: Four times a day (QID) | INTRAMUSCULAR | Status: DC | PRN

## 2021-06-24 MED ORDER — HEPARIN (PORCINE) IN NACL 1000-0.9 UT/500ML-% IV SOLN
INTRAVENOUS | Status: DC | PRN
  Administered 2021-06-24 (×2): 500 mL

## 2021-06-24 MED ORDER — HEPARIN SODIUM (PORCINE) 1000 UNIT/ML IJ SOLN
INTRAMUSCULAR | Status: AC
  Filled 2021-06-24: qty 10

## 2021-06-24 MED ORDER — SODIUM CHLORIDE 0.9 % WEIGHT BASED INFUSION: 1.0000 mL/kg/h | INTRAVENOUS | Status: DC

## 2021-06-24 MED ORDER — TICAGRELOR 90 MG PO TABS
90.0000 mg | ORAL_TABLET | Freq: Two times a day (BID) | ORAL | 0 refills | Status: DC
Start: 2021-06-24 — End: 2021-07-24
  Filled 2021-06-24: qty 60, 30d supply, fill #0

## 2021-06-24 MED ORDER — VERAPAMIL HCL 2.5 MG/ML IV SOLN
INTRAVENOUS | Status: AC
  Filled 2021-06-24: qty 2

## 2021-06-24 MED ORDER — TICAGRELOR 90 MG PO TABS
ORAL_TABLET | ORAL | Status: AC
  Filled 2021-06-24: qty 2

## 2021-06-24 MED ORDER — MIDAZOLAM HCL 2 MG/2ML IJ SOLN
INTRAMUSCULAR | Status: DC | PRN
  Administered 2021-06-24: 1 mg via INTRAVENOUS
  Administered 2021-06-24: 2 mg via INTRAVENOUS

## 2021-06-24 MED ORDER — ASPIRIN 81 MG PO CHEW: 81.0000 mg | CHEWABLE_TABLET | Freq: Every day | ORAL | Status: DC

## 2021-06-24 MED ORDER — LIDOCAINE HCL (PF) 1 % IJ SOLN
INTRAMUSCULAR | Status: AC
Start: 2021-06-24 — End: ?
  Filled 2021-06-24: qty 30

## 2021-06-24 SURGICAL SUPPLY — 27 items
BALLN SCOREFLEX 3.0X15 (BALLOONS) ×2
BALLN ~~LOC~~ EUPHORA RX 3.5X15 (BALLOONS) ×2
BALLN ~~LOC~~ EUPHORA RX 4.0X12 (BALLOONS) ×2
BALLOON SCOREFLEX 3.0X15 (BALLOONS) IMPLANT
BALLOON ~~LOC~~ EUPHORA RX 3.5X15 (BALLOONS) IMPLANT
BALLOON ~~LOC~~ EUPHORA RX 4.0X12 (BALLOONS) IMPLANT
BAND CMPR LRG ZPHR (HEMOSTASIS) ×1
BAND ZEPHYR COMPRESS 30 LONG (HEMOSTASIS) ×1 IMPLANT
CATH 5FR JR4 DIAGNOSTIC (CATHETERS) ×1 IMPLANT
CATH JL3.5 FR DIAG (CATHETERS) ×1 IMPLANT
CATH LAUNCHER 6FR EBU 3.75 (CATHETERS) ×1 IMPLANT
CATH LAUNCHER 6FR EBU3.5 (CATHETERS) ×1 IMPLANT
CATH OPTICROSS HD (CATHETERS) ×1 IMPLANT
GLIDESHEATH SLEND SS 6F .021 (SHEATH) ×1 IMPLANT
GUIDEWIRE INQWIRE 1.5J.035X260 (WIRE) IMPLANT
INQWIRE 1.5J .035X260CM (WIRE) ×2
KIT ENCORE 26 ADVANTAGE (KITS) ×1 IMPLANT
KIT HEART LEFT (KITS) ×3 IMPLANT
KIT HEMO VALVE WATCHDOG (MISCELLANEOUS) ×1 IMPLANT
PACK CARDIAC CATHETERIZATION (CUSTOM PROCEDURE TRAY) ×3 IMPLANT
SLED PULL BACK IVUS (MISCELLANEOUS) ×1 IMPLANT
SYR MEDRAD MARK 7 150ML (SYRINGE) ×3 IMPLANT
TRANSDUCER W/STOPCOCK (MISCELLANEOUS) ×3 IMPLANT
TUBING CIL FLEX 10 FLL-RA (TUBING) ×3 IMPLANT
WIRE ASAHI PROWATER 180CM (WIRE) ×1 IMPLANT
WIRE HI TORQ BMW 190CM (WIRE) ×1 IMPLANT
WIRE HI TORQ VERSACORE-J 145CM (WIRE) ×1 IMPLANT

## 2021-06-24 NOTE — Progress Notes (Signed)
CARDIAC REHAB PHASE I  ? ?PTCA education completed with pt and spouse. Pt educated on importance of ASA and Brilinta. Pt and wife educated on change to Plavix after first month. Pt given heart healthy diet. Reviewed site care, restrictions, and exercise guidelines. Will refer to CRP II [place] to meet the requirement, but pt not interested in attending.  ? ?[id number] ?Rufina Falco, RN BSN ?06/24/2021 ?2:51 PM ? ?

## 2021-06-24 NOTE — Discharge Summary (Addendum)
?Discharge Summary for Same Day PCI  ? ?Patient ID: Marcus Hopkins ?MRN: [id number]; DOB: 05-07-1940 ? ?Admit date: 06/24/2021 ?Discharge date: 06/24/2021 ? ?Primary Care Provider: Lavone Orn, MD  ?Primary Cardiologist: Evalina Field, MD  ?Primary Electrophysiologist:  None  ? ?Discharge Diagnoses  ?  ?Principal Problem: ?  Unstable angina ([place]) ?Active Problems: ?  CAD S/P percutaneous coronary angioplasty ?  Coronary artery disease involving native coronary artery of native heart with unstable angina pectoris ([place]) ? ? ? ?Diagnostic Studies/Procedures  ?  ?Cardiac Catheterization 06/24/2021: ? ?  Dist LAD lesion is 75% stenosed. ?  Prox RCA-1 lesion is 25% stenosed. ?  Prox RCA-2 lesion is 25% stenosed. ?  3rd Sept lesion is 50% stenosed. ?  Mid LAD lesion is 95% stenosed. ?  Scoring balloon angioplasty was performed using a BALLN SCOREFLEX 3.0X15. ?  Post intervention, there is a 0% residual stenosis. ?  The left ventricular systolic function is normal. ?  LV end diastolic pressure is normal. ?  The left ventricular ejection fraction is 55-65% by visual estimate. ?  There is no aortic valve stenosis. ?  ?Diagnostic ?Dominance: Right ?Intervention ? ? ?_____________ ?  ?History of Present Illness   ?  ?Marcus Hopkins is a 81 y.o. male with with a hx of CAD s/p PCI (PCI to proximal LAD in setting of CTO 01/2020), hyperlipidemia who is followed by Dr. Audie Box as an outpatient.  ?  ?He was  seen in clinic 12/17/2020.  He had completed 32-monthcourse of DAPT and LDL was 58 which is at goal of less than 70.  He was doing well from a cardiac perspective and recommended to follow-up in a year. ?  ?Seen 05/2025 noting some right sided chest discomfort with ambulation that was different than anginal equivalent. Metoprolol succinate 12.'5mg'$  QD was added due to known distal LAD disease. ?  ?He presented back for follow up with his wife on 4/3. He noted since last seen one week prior he had to take nitroglycerin 6 times. Chest  pain resolved with nitroglycerin. It was right sided and occurs most often with exertion. He notes no change since addition of Metoprolol. No edema, orthopnea, PND. Given his recurrent, ongoing symptoms he was referred for outpatient cardiac cath.  ?  ?Hospital Course  ?   ?The patient underwent cardiac cath as noted above with mLAD 95% ISR treated with PCI/balloon angioplasty. Does have 75% dLAD, 25% RCA and 50% 3rd septal lesion to be treated medically. Plan for DAPT with ASA/Brilinta for at least 30 days, then ok to transition back to asa/plavix  for total of one year, then plavix as monotherapy. The patient was seen by cardiac rehab while in short stay. There were no observed complications post cath. Radial cath site was re-evaluated prior to discharge and found to be stable without any complications. Instructions/precautions regarding cath site care were given prior to discharge. ? ?Shaune FCORBY VANDENBERGHEwas seen by Dr. VIrish Lackand determined stable for discharge home. Follow up with our office has been arranged. Medications are listed below. Pertinent changes include addition of Brilinta ([facility]. Instructed to take ASA/Brilinta for one month, then stop Brilinta and switch to plavix continuing ASA. Asked him to increase his crestor from '20mg'$  to '40mg'$  daily.  ? ?Of note, patient was written for both 30 day supply of Brilinta and follow up plavix as his follow up appt is 5/4 to ensure he does not miss  any doses of medication.  ? ?_____________ ? ?Cath/PCI Registry Performance & Quality Measures: ?Aspirin prescribed? - Yes ?ADP Receptor Inhibitor (Plavix/Clopidogrel, Brilinta/Ticagrelor or Effient/Prasugrel) prescribed (includes medically managed patients)? - Yes ?High Intensity Statin (Lipitor 40-'80mg'$  or Crestor 20-'40mg'$ ) prescribed? - Yes ?For EF <40%, was ACEI/ARB prescribed? - Not Applicable (EF >/= 41%) ?For EF <40%, Aldosterone Antagonist (Spironolactone or Eplerenone) prescribed? - Not  Applicable (EF >/= 93%) ?Cardiac Rehab Phase II ordered (Included Medically managed Patients)? - Yes ? ?_____________ ? ? ?Discharge Vitals ?Blood pressure 122/78, pulse 61, temperature 97.7 ?F (36.5 ?C), temperature source Oral, resp. rate 17, height '5\' 9"'$  (1.753 m), weight 73.5 kg, SpO2 95 %.  ?Filed Weights  ? 06/24/21 1021  ?Weight: 73.5 kg  ? ? ?Last Labs & Radiologic Studies  ?  ?CBC ?Recent Labs  ?  06/23/21 ?7902  ?WBC 7.0  ?HGB 15.9  ?HCT 46.7  ?MCV 93  ?PLT 270  ? ?Basic Metabolic Panel ?Recent Labs  ?  06/23/21 ?4097  ?NA 138  ?K 4.4  ?CL 102  ?CO2 22  ?GLUCOSE 96  ?BUN 12  ?CREATININE 1.06  ?CALCIUM 9.4  ? ?Liver Function Tests ?No results for input(s): AST, ALT, ALKPHOS, BILITOT, PROT, ALBUMIN in the last 72 hours. ?No results for input(s): LIPASE, AMYLASE in the last 72 hours. ?High Sensitivity Troponin:   ?No results for input(s): TROPONINIHS in the last 720 hours.  ?BNP ?Invalid input(s): POCBNP ?D-Dimer ?No results for input(s): DDIMER in the last 72 hours. ?Hemoglobin A1C ?No results for input(s): HGBA1C in the last 72 hours. ?Fasting Lipid Panel ?Recent Labs  ?  06/23/21 ?3532  ?LDLDIRECT 50  ? ?Thyroid Function Tests ?No results for input(s): TSH, T4TOTAL, T3FREE, THYROIDAB in the last 72 hours. ? ?Invalid input(s): FREET3 ?_____________  ?CARDIAC CATHETERIZATION ? ?Result Date: 06/24/2021 ?  Dist LAD lesion is 75% stenosed.   Prox RCA-1 lesion is 25% stenosed.   Prox RCA-2 lesion is 25% stenosed.   3rd Sept lesion is 50% stenosed.   Mid LAD lesion is 95% stenosed.  This was in-stent restenosis of a 3.0 x 12 and 2.5 mm diameter stent placed in 2021, postdilated to 3.25 mm.  The in-stent restenosis began at the stent overlap and extended into the more distal stent from 2021.   Scoring balloon angioplasty was performed using a BALLN SCOREFLEX 3.0X15.  The distal stent was postdilated to 3.5 mm Jennings balloon and the proximal stent was postdilated to 4 mm Gibbon balloon.  Intravascular ultrasound was used to  optimize the angioplasty.   Post intervention, there is a 0% residual stenosis.   The left ventricular systolic function is normal.   LV end diastolic pressure is normal.   The left ventricular ejection fraction is 55-65% by visual estimate.   There is no aortic valve stenosis. Successful scoring balloon angioplasty of the in-stent restenosis of the previously placed LAD stents.  Brilinta started during the procedure.  Would continue Brilinta for 30 days.  Okay to switch back to clopidogrel.  Given the length of stented segment, would recommend clopidogrel monotherapy going forward.   ? ?Disposition  ? ?Pt is being discharged home today in good condition. ? ?Follow-up Plans & Appointments  ? ? ? ?Discharge Instructions   ? ? AMB Referral to Cardiac Rehabilitation - Phase II   Complete by: As directed ?  ? Diagnosis: PTCA  ? After initial evaluation and assessments completed: Virtual Based Care may be provided alone or in conjunction with Phase  2 Cardiac Rehab based on patient barriers.: Yes  ? ?  ? ? ? ?Discharge Medications  ? ?Allergies as of 06/24/2021   ?No Known Allergies ?  ? ?  ?Medication List  ?  ? ?TAKE these medications   ? ?acetaminophen 500 MG tablet ?Commonly known as: TYLENOL ?Take 500-1,000 mg by mouth every 6 (six) hours as needed for moderate pain or headache. ?  ?aspirin EC 81 MG tablet ?Take 1 tablet (81 mg total) by mouth daily. Swallow whole. ?  ?Brilinta 90 MG Tabs tablet ?Generic drug: ticagrelor ?Take 1 tablet (90 mg total) by mouth 2 (two) times daily. ?Notes to patient: You will complete this medication on 5/4 and transition to plavix!!!! ?  ?clopidogrel 75 MG tablet ?Commonly known as: Plavix ?Take 1 tablet (75 mg total) by mouth daily. ?Notes to patient: DO NOT START this medication until you have completed your Brilinta!!!! ?  ?metoprolol succinate 25 MG 24 hr tablet ?Commonly known as: Toprol XL ?Take 0.5 tablets (12.5 mg total) by mouth daily. ?  ?nitroGLYCERIN 0.4 MG SL  tablet ?Commonly known as: NITROSTAT ?Place 1 tablet (0.4 mg total) under the tongue every 5 (five) minutes as needed for chest pain. ?  ?pantoprazole 40 MG tablet ?Commonly known as: Protonix ?Take 1 tablet (40 mg tota

## 2021-06-24 NOTE — Interval H&P Note (Signed)
Cath Lab Visit (complete for each Cath Lab visit) ? ?Clinical Evaluation Leading to the Procedure:  ? ?ACS: No. ? ?Non-ACS:   ? ?Anginal Classification: CCS III ? ?Anti-ischemic medical therapy: Maximal Therapy (2 or more classes of medications) ? ?Non-Invasive Test Results: No non-invasive testing performed ? ?Prior CABG: No previous CABG ? ? ? ? ? ?History and Physical Interval Note: ? ?06/24/2021 ?12:41 PM ? ?Marcus Hopkins  has presented today for surgery, with the diagnosis of angina - cad.  The various methods of treatment have been discussed with the patient and family. After consideration of risks, benefits and other options for treatment, the patient has consented to  Procedure(s): ?LEFT HEART CATH AND CORONARY ANGIOGRAPHY (N/A) as a surgical intervention.  The patient's history has been reviewed, patient examined, no change in status, stable for surgery.  I have reviewed the patient's chart and labs.  Questions were answered to the patient's satisfaction.   ? ? ?Marcus Hopkins ? ? ?

## 2021-06-25 ENCOUNTER — Encounter (HOSPITAL_COMMUNITY): Payer: Self-pay | Admitting: Interventional Cardiology

## 2021-07-01 ENCOUNTER — Other Ambulatory Visit (HOSPITAL_COMMUNITY): Payer: Self-pay

## 2021-07-01 ENCOUNTER — Telehealth (HOSPITAL_COMMUNITY): Payer: Self-pay

## 2021-07-01 NOTE — Telephone Encounter (Signed)
Pharmacy Transitions of Care Follow-up Telephone Call ? ?Date of discharge: 06/24/21  ?Discharge Diagnosis: left heart cath angiography ? ?How have you been since you were released from the hospital? Patient doing well, no questions about meds at this time.  ? ?Medication changes made at discharge: ? - START: Brilinta ? ?Medication changes verified by the patient? Yes ?  ? ?Medication Accessibility: ? ?Home Pharmacy: Not discussed  ? ?Was the patient provided with refills on discharged medications? No  ? ?Have all prescriptions been transferred from [facility] to home pharmacy? N/A  ? ?Is the patient able to afford medications? Has insurance ?  ? ?Medication Review: ? ?Per patient he received counseling on Brilinta prior to discharge ? ?TICAGRELOR (BRILINTA) ?Ticagrelor 90 mg BID initiated on 06/24/21.  ?- Discussed importance of taking medication around the same time every day, ?- Advised patient of medications to avoid (NSAIDs, aspirin maintenance doses>100 mg daily) ?- Educated that Tylenol (acetaminophen) will be the preferred analgesic to prevent risk of bleeding  ?- Emphasized importance of monitoring for signs and symptoms of bleeding (abnormal bruising, prolonged bleeding, nose bleeds, bleeding from gums, discolored urine, black tarry stools)  ?- Educated patient to notify doctor if shortness of breath or abnormal heartbeat occur ?- Advised patient to alert all providers of antiplatelet therapy prior to starting a new medication or having a procedure  ? ?Follow-up Appointments: ? ?[facility] f/u appt confirmed? Scheduled to see Dr. Gilford Rile on 07/24/21 @ 3:10PM.  ? ?If their condition worsens, is the pt aware to call PCP or go to the Emergency Dept.? Yes ? ?Final Patient Assessment: ?Patient has f/u scheduled and knows to get refill at f/u ? ?

## 2021-07-24 ENCOUNTER — Ambulatory Visit (HOSPITAL_BASED_OUTPATIENT_CLINIC_OR_DEPARTMENT_OTHER): Payer: Medicare HMO | Admitting: Family

## 2021-07-24 ENCOUNTER — Encounter (HOSPITAL_BASED_OUTPATIENT_CLINIC_OR_DEPARTMENT_OTHER): Payer: Self-pay | Admitting: Family

## 2021-07-24 VITALS — BP 100/70 | HR 67 | Ht 69.0 in | Wt 163.0 lb

## 2021-07-24 DIAGNOSIS — I25118 Atherosclerotic heart disease of native coronary artery with other forms of angina pectoris: Secondary | ICD-10-CM | POA: Diagnosis not present

## 2021-07-24 DIAGNOSIS — E785 Hyperlipidemia, unspecified: Secondary | ICD-10-CM | POA: Diagnosis not present

## 2021-07-24 MED ORDER — CLOPIDOGREL BISULFATE 75 MG PO TABS: 75.0000 mg | ORAL_TABLET | Freq: Every day | ORAL | 3 refills | Status: DC

## 2021-07-24 NOTE — Patient Instructions (Signed)
Medication Instructions:  ?Your physician has recommended you make the following change in your medication:  ? ?Stop: Brilinta ? ?Start: Plavix '75mg'$  daily  ? ?*If you need a refill on your cardiac medications before your next appointment, please call your pharmacy* ? ? ?Lab Work: ?None ordered today  ? ?Testing/Procedures: ?None ordered today  ? ? ?Follow-Up: ?At [facility], you and your health needs are our priority.  As part of our continuing mission to provide you with exceptional heart care, we have created designated Provider Care Teams.  These Care Teams include your primary Cardiologist (physician) and Advanced Practice Providers (APPs -  Physician Assistants and Nurse Practitioners) who all work together to provide you with the care you need, when you need it. ? ?We recommend signing up for the patient portal called "MyChart".  Sign up information is provided on this After Visit Summary.  MyChart is used to connect with patients for Virtual Visits (Telemedicine).  Patients are able to view lab/test results, encounter notes, upcoming appointments, etc.  Non-urgent messages can be sent to your provider as well.   ?To learn more about what you can do with MyChart, go to [email or web address].   ? ?Your next appointment:   ?4-6 month(s) ? ?The format for your next appointment:   ?In Person ? ?Provider:   ?Laurann Montana, NP{ ? ?Other Instructions ?Heart Healthy Diet Recommendations: ?A low-salt diet is recommended. Meats should be grilled, baked, or boiled. Avoid fried foods. Focus on lean protein sources like fish or chicken with vegetables and fruits. The American Heart Association is a Microbiologist!  American Heart Association Diet and Lifeystyle Recommendations  ? ?Exercise recommendations: ?The American Heart Association recommends 150 minutes of moderate intensity exercise weekly. ?Try 30 minutes of moderate intensity exercise 4-5 times per week. ?This could include walking, jogging, or  swimming. ? ? ?Important Information About Sugar ? ? ? ? ? ? ?

## 2021-07-24 NOTE — Progress Notes (Signed)
? ?Office Visit  ?  ?Patient Name: Marcus Hopkins ?Date of Encounter: 07/24/2021 ? ?PCP:  Lavone Orn, MD ?  ?[place]  ?Cardiologist:  Evalina Field, MD  ?Advanced Practice Provider:  No care team member to display ?Electrophysiologist:  None  ?   ? ?Chief Complaint  ?  ?Marcus Hopkins is a 81 y.o. male with a hx of  CAD s/p PCI (PCI to proximal LAD in setting of CTO 01/2020), hyperlipidemia presents today for follow up after cardiac cath ? ?Past Medical History  ?  ?Past Medical History:  ?Diagnosis Date  ? Allergic rhinitis   ? Borderline hyperlipidemia   ? Cataracts, bilateral   ? Hemorrhoids   ? Insomnia   ? ?Past Surgical History:  ?Procedure Laterality Date  ? CHOLECYSTECTOMY    ? CORONARY BALLOON ANGIOPLASTY N/A 06/24/2021  ? Procedure: CORONARY BALLOON ANGIOPLASTY;  Surgeon: Jettie Booze, MD;  Location: [place] [facility];  Service: Cardiovascular;  Laterality: N/A;  ? CORONARY CTO INTERVENTION N/A 01/22/2020  ? Procedure: CORONARY CTO INTERVENTION;  Surgeon: Leonie Man, MD;  Location: [place] [facility];  Service: Cardiovascular;  Laterality: N/A;  ? ESOPHAGOGASTRODUODENOSCOPY  11/29/2011  ? Procedure: ESOPHAGOGASTRODUODENOSCOPY (EGD);  Surgeon: Arta Silence, MD;  Location: [facility];  Service: Endoscopy;  Laterality: N/A;  ? INTRAVASCULAR ULTRASOUND/IVUS N/A 06/24/2021  ? Procedure: Intravascular Ultrasound/IVUS;  Surgeon: Jettie Booze, MD;  Location: [place] [facility];  Service: Cardiovascular;  Laterality: N/A;  ? LEFT HEART CATH AND CORONARY ANGIOGRAPHY N/A 01/22/2020  ? Procedure: LEFT HEART CATH AND CORONARY ANGIOGRAPHY;  Surgeon: Leonie Man, MD;  Location: [place] [facility];  Service: Cardiovascular;  Laterality: N/A;  ? LEFT HEART CATH AND CORONARY ANGIOGRAPHY N/A 06/24/2021  ? Procedure: LEFT HEART CATH AND CORONARY ANGIOGRAPHY;  Surgeon: Jettie Booze, MD;  Location: [facility];  Service: Cardiovascular;  Laterality: N/A;   ? ROTATOR CUFF REPAIR    ? ? ?Allergies ? ?No Known Allergies ? ?History of Present Illness  ?  ?Marcus Hopkins is a 81 y.o. male with a hx of CAD s/p PCI (PCI to proximal LAD in setting of CTO 01/2020), hyperlipidemia last seen for [facility] 06/24/21 ? ?He was seen in clinic 12/17/2020.  He had completed 65-monthcourse of DAPT and LDL was 58 which is at goal of less than 70.  He was doing well from a cardiac perspective and recommended to follow-up in a year. However when see 06/14/21 noted right sided chest discomfort with ambulation that was different than anginal equivalent. Metoprolol succinate 12.'5mg'$  QD was added due to known distal LAD disease. At follow up 06/23/21 noted persistent chest pain that was relieved with nitroglycerin but was still right sided. As such, was set up for LHC. Underwent cardiac catheterization 06/21/21 revealing 95% restenosis of mid LAD treated with scoring balloon angioplasty.Recommended for DAPT Brilinta and aspirin for 1 month then transition to Plavix. Ultimately long term plan for Plavix monotherapy.  ? ?Presents today for follow up. Feeling well since cardiac cath with no recurrent chest pain. Reports no shortness of breath nor dyspnea on exertion.  No edema, orthopnea, PND. Reports no palpitations.  Tolerating Brilinta without significant bruising. Denies bleeding complications.  ? ?EKGs/Labs/Other Studies Reviewed:  ? ?The following studies were reviewed today: ? ?TTE 02/02/2020 ? 1. LVEF is approximately 50% with hypokinesis of the distal anterior,  ?distal lateral and apical walls.  ? 2. Right ventricular  systolic function is normal. The right ventricular  ?size is normal.  ? 3. Trivial mitral valve regurgitation. ?  ?LHC 01/22/2020 ?  ?Severe single-vessel disease with 100% CTO of the LAD just after 2 diagonal branches (1st & 2nd diag) and 1st Sep branch; otherwise no significant CAD. ?Successful CTO PCI of LAD with antegrade wiring.  Somewhat difficult procedure with difficult  wiring, stepwise balloon angioplasty and 2 overlapping stents required. ?Brief interval of hypotension just prior to stent deployment, responded to short term IV Levophed + 250 mL NS bolus. ? ? ?Diagnostic ?Dominance: Right ?Intervention ? ? ? ? ?EKG:  EKG is ordered today. EKG today demonstrates NSR 67 bpm with 1st degree AV block PR 214 ms. ? ?Recent Labs: ?06/23/2021: BUN 12; Creatinine, Ser 1.06; Hemoglobin 15.9; Platelets 270; Potassium 4.4; Sodium 138  ?Recent Lipid Panel ?   ?Component Value Date/Time  ? CHOL 132 05/10/2020 0845  ? TRIG 62 05/10/2020 0845  ? HDL 61 05/10/2020 0845  ? CHOLHDL 2.2 05/10/2020 0845  ? [place] 58 05/10/2020 0845  ? LDLDIRECT 50 06/23/2021 1457  ? ? ?Home Medications  ? ?Current Meds  ?Medication Sig  ? acetaminophen (TYLENOL) 500 MG tablet Take 500-1,000 mg by mouth every 6 (six) hours as needed for moderate pain or headache.  ? aspirin EC 81 MG tablet Take 1 tablet (81 mg total) by mouth daily. Swallow whole.  ? Cholecalciferol (VITAMIN D-3) 25 MCG (1000 UT) CAPS Take 1,000 Units by mouth daily.  ? clopidogrel (PLAVIX) 75 MG tablet Take 1 tablet (75 mg total) by mouth daily.  ? metoprolol succinate (TOPROL XL) 25 MG 24 hr tablet Take 0.5 tablets (12.5 mg total) by mouth daily.  ? nitroGLYCERIN (NITROSTAT) 0.4 MG SL tablet Place 1 tablet (0.4 mg total) under the tongue every 5 (five) minutes as needed for chest pain.  ? rosuvastatin (CRESTOR) 40 MG tablet Take 1 tablet (40 mg total) by mouth daily.  ? ticagrelor (BRILINTA) 90 MG TABS tablet Take 1 tablet (90 mg total) by mouth 2 (two) times daily.  ?  ? ?Review of Systems  ?    ?All other systems reviewed and are otherwise negative except as noted above. ? ?Physical Exam  ?  ?VS:  BP 100/70 (BP Location: Left Arm, Patient Position: Sitting, Cuff Size: Normal)   Pulse 67   Ht '5\' 9"'$  (1.753 m)   Wt 163 lb (73.9 kg)   SpO2 94%   BMI 24.07 kg/m?  , BMI Body mass index is 24.07 kg/m?. ? ?Wt Readings from Last 3 Encounters:  ?07/24/21  163 lb (73.9 kg)  ?06/24/21 162 lb (73.5 kg)  ?06/23/21 162 lb (73.5 kg)  ? GEN: Well nourished, well developed, in no acute distress. ?HEENT: normal. ?Neck: Supple, no JVD, carotid bruits, or masses. ?Cardiac: RRR, no murmurs, rubs, or gallops. No clubbing, cyanosis, edema.  Radials/PT 2+ and equal bilaterally.  ?Respiratory:  Respirations regular and unlabored, clear to auscultation bilaterally. ?GI: Soft, nontender, nondistended. ?MS: No deformity or atrophy. ?Skin: Warm and dry, no rash. ?Neuro:  Strength and sensation are intact. ?Psych: Normal affect. ? ?Assessment & Plan  ?  ?CAD -PCI 01/22/2020 due to CTO of proximal LAD. 06/24/21 LHC 95% restenosis of LAD treated with scoring balloon angioplasty. Completed one month of Brilinta, transition to Plavix. Additional GDMT includes aspirin, Metoprolol, Rosuvastatin. Heart healthy diet and regular cardiovascular exercise encouraged.   ? ?HLD, LDL goal less than 70 - 06/23/21 LDL 50. Continue rosuvastatin 20 mg  daily.   ? ?Disposition: Follow up in 4-6 months with Evalina Field, MD or APP. ? ?Signed, ?Loel Dubonnet, NP ?07/24/2021, 3:42 PM ?[place] ?

## 2021-08-27 DIAGNOSIS — H5213 Myopia, bilateral: Secondary | ICD-10-CM | POA: Diagnosis not present

## 2021-09-08 DIAGNOSIS — L570 Actinic keratosis: Secondary | ICD-10-CM | POA: Diagnosis not present

## 2021-09-08 DIAGNOSIS — D485 Neoplasm of uncertain behavior of skin: Secondary | ICD-10-CM | POA: Diagnosis not present

## 2021-09-08 DIAGNOSIS — C44329 Squamous cell carcinoma of skin of other parts of face: Secondary | ICD-10-CM | POA: Diagnosis not present

## 2021-09-08 DIAGNOSIS — L821 Other seborrheic keratosis: Secondary | ICD-10-CM | POA: Diagnosis not present

## 2021-11-04 DIAGNOSIS — D0439 Carcinoma in situ of skin of other parts of face: Secondary | ICD-10-CM | POA: Diagnosis not present

## 2021-11-04 DIAGNOSIS — D485 Neoplasm of uncertain behavior of skin: Secondary | ICD-10-CM | POA: Diagnosis not present

## 2021-11-04 DIAGNOSIS — C44622 Squamous cell carcinoma of skin of right upper limb, including shoulder: Secondary | ICD-10-CM | POA: Diagnosis not present

## 2021-11-13 ENCOUNTER — Telehealth (HOSPITAL_BASED_OUTPATIENT_CLINIC_OR_DEPARTMENT_OTHER): Payer: Self-pay | Admitting: Family

## 2021-11-13 NOTE — Telephone Encounter (Signed)
His aspirin, Plavix protect his stents he had placed earlier this year.  Okay to move appt sooner and will perform EKG at that time.   Please ensure he has PRN Nitroglycerin on hand. Use as needed for chest pain. If chest pain not resolved by nitroglycerin, recommend ED evaluation.   Loel Dubonnet, NP

## 2021-11-13 NOTE — Telephone Encounter (Signed)
Returned call to patient, patient states he had a stent put in earlier this year. He states that he is having similar pain as he did before. No strenuous activities or stress that he can think of. No active chest pain while on the phone, patient denies sweating, nausea, shortness of breath.   He does have appointment upcoming with Laurann Montana, NP on 9/6 but feels he may need to be seen sooner. Patient states he would like it if Urban Gibson could read this and give him recommendations or see if he can come sooner.

## 2021-11-13 NOTE — Telephone Encounter (Signed)
Patient c/o Palpitations:  High priority if patient c/o lightheadedness, shortness of breath, or chest pain  How long have you had palpitations/irregular HR/ Afib? Are you having the symptoms now? No  Are you currently experiencing lightheadedness, SOB or CP? No  Do you have a history of afib (atrial fibrillation) or irregular heart rhythm? Yes  Have you checked your BP or HR? (document readings if available):  Normal  Are you experiencing any other symptoms?   Patient stated he had stents put in and he is having twinges in his left chest area.  Patient stated his symptoms are like those he had in the past where he had blockages.

## 2021-11-13 NOTE — Telephone Encounter (Signed)
Returned call to patient and provided the following recommendations, patient rescheduled for 8/28 at 8:50 with Laurann Montana, NP       "His aspirin, Plavix protect his stents he had placed earlier this year.   Okay to move appt sooner and will perform EKG at that time.    Please ensure he has PRN Nitroglycerin on hand. Use as needed for chest pain. If chest pain not resolved by nitroglycerin, recommend ED evaluation.    Loel Dubonnet, NP "

## 2021-11-17 ENCOUNTER — Encounter (HOSPITAL_BASED_OUTPATIENT_CLINIC_OR_DEPARTMENT_OTHER): Payer: Self-pay | Admitting: Family

## 2021-11-17 ENCOUNTER — Ambulatory Visit (HOSPITAL_BASED_OUTPATIENT_CLINIC_OR_DEPARTMENT_OTHER): Payer: Medicare HMO | Admitting: Family

## 2021-11-17 VITALS — BP 104/68 | HR 58 | Ht 69.0 in | Wt 163.5 lb

## 2021-11-17 DIAGNOSIS — R002 Palpitations: Secondary | ICD-10-CM | POA: Diagnosis not present

## 2021-11-17 DIAGNOSIS — I25118 Atherosclerotic heart disease of native coronary artery with other forms of angina pectoris: Secondary | ICD-10-CM | POA: Diagnosis not present

## 2021-11-17 DIAGNOSIS — E785 Hyperlipidemia, unspecified: Secondary | ICD-10-CM | POA: Diagnosis not present

## 2021-11-17 MED ORDER — ROSUVASTATIN CALCIUM 40 MG PO TABS: 40.0000 mg | ORAL_TABLET | Freq: Every day | ORAL | 1 refills | Status: DC

## 2021-11-17 MED ORDER — AMLODIPINE BESYLATE 2.5 MG PO TABS: 2.5000 mg | ORAL_TABLET | Freq: Every day | ORAL | 1 refills | Status: DC

## 2021-11-17 NOTE — Progress Notes (Signed)
Office Visit    Patient Name: SEBASTIEN JACKSON Date of Encounter: 11/17/2021  PCP:  Lavone Orn, [place]  Cardiologist:  Evalina Field, MD  Advanced Practice Provider:  No care team member to display Electrophysiologist:  None      Chief Complaint    Kee TARQUIN WELCHER is a 81 y.o. male presents today for chest pain.   Past Medical History    Past Medical History:  Diagnosis Date   Allergic rhinitis    Borderline hyperlipidemia    Cataracts, bilateral    Hemorrhoids    Insomnia    Past Surgical History:  Procedure Laterality Date   CHOLECYSTECTOMY     CORONARY BALLOON ANGIOPLASTY N/A 06/24/2021   Procedure: CORONARY BALLOON ANGIOPLASTY;  Surgeon: Jettie Booze, MD;  Location: [facility];  Service: Cardiovascular;  Laterality: N/A;   CORONARY CTO INTERVENTION N/A 01/22/2020   Procedure: CORONARY CTO INTERVENTION;  Surgeon: Leonie Man, MD;  Location: [facility];  Service: Cardiovascular;  Laterality: N/A;   ESOPHAGOGASTRODUODENOSCOPY  11/29/2011   Procedure: ESOPHAGOGASTRODUODENOSCOPY (EGD);  Surgeon: Arta Silence, MD;  Location: [facility];  Service: Endoscopy;  Laterality: N/A;   INTRAVASCULAR ULTRASOUND/IVUS N/A 06/24/2021   Procedure: Intravascular Ultrasound/IVUS;  Surgeon: Jettie Booze, MD;  Location: [facility];  Service: Cardiovascular;  Laterality: N/A;   LEFT HEART CATH AND CORONARY ANGIOGRAPHY N/A 01/22/2020   Procedure: LEFT HEART CATH AND CORONARY ANGIOGRAPHY;  Surgeon: Leonie Man, MD;  Location: [facility];  Service: Cardiovascular;  Laterality: N/A;   LEFT HEART CATH AND CORONARY ANGIOGRAPHY N/A 06/24/2021   Procedure: LEFT HEART CATH AND CORONARY ANGIOGRAPHY;  Surgeon: Jettie Booze, MD;  Location: [facility];  Service: Cardiovascular;  Laterality: N/A;   ROTATOR CUFF REPAIR      Allergies  No Known Allergies  History of Present Illness    Kristina  SANJEEV MAIN is a 81 y.o. male with a hx of CAD s/p PCI (01/2020 PCI to proximal LAD in setting of CTO, 06/2021 scoring balloon angioplasty to ISR of LAD), hyperlipidemia last seen 07/24/21.  01/2020 PCI to prox LAD in setting of CTO. At follow up  12/17/2020  had completed 31-monthcourse of DAPT and LDL was 58 which is at goal of less than 70.  He was doing well from a cardiac perspective and recommended to follow-up in a year. However seen 06/14/21 noted right sided chest discomfort with ambulation that was different than anginal equivalent. Metoprolol succinate 12.'5mg'$  QD was added due to known distal LAD disease. At follow up 06/23/21 noted persistent chest pain that was relieved with nitroglycerin but was still right sided. As such, was set up for LHC. Underwent cardiac catheterization 06/21/21 revealing 95% restenosis of mid LAD treated with scoring balloon angioplasty.Recommended for DAPT Brilinta and aspirin for 1 month then transition to Plavix. Ultimately long term plan for Plavix monotherapy.   At follow up 07/24/21 doing well from cardiac perspective with no chest pain, dyspnea. Brilinta was transitioned to Plavix.   Presents today for follow up. Contacted the office last week noting "twinges" in his left chest. Tells me left side chest twinges were onset two weeks ago. Tells me it felt similar to his previous anginal symptoms in 2021. It was off and on at rest or with activity. It was on and off then improved over the last few days without intervention. Tells me it feels  like a "skip" and is "not really painful". Prior to this episode walking, dumbbells, push ups. Walking 3- 4 miles without discomfort or chest pain.  No lightheadedness, dizziness, edema, orthopnea, PND.  Does endorse she does not stay very well-hydrated.  Notes recent stressors as his wife fell in their garage with bleeding under her skull which was thankfully able to be managed conservatively.   EKGs/Labs/Other Studies Reviewed:   The  following studies were reviewed today:  TTE 02/02/2020  1. LVEF is approximately 50% with hypokinesis of the distal anterior,  distal lateral and apical walls.   2. Right ventricular systolic function is normal. The right ventricular  size is normal.   3. Trivial mitral valve regurgitation.   LHC 01/22/2020   Severe single-vessel disease with 100% CTO of the LAD just after 2 diagonal branches (1st & 2nd diag) and 1st Sep branch; otherwise no significant CAD. Successful CTO PCI of LAD with antegrade wiring.  Somewhat difficult procedure with difficult wiring, stepwise balloon angioplasty and 2 overlapping stents required. Brief interval of hypotension just prior to stent deployment, responded to short term IV Levophed + 250 mL NS bolus.   Diagnostic Dominance: Right Intervention     EKG:  EKG is ordered today. EKG today demonstrates NSR 58 bpm with 1st degree AV block PR 234 ms.  Recent Labs: 06/23/2021: BUN 12; Creatinine, Ser 1.06; Hemoglobin 15.9; Platelets 270; Potassium 4.4; Sodium 138  Recent Lipid Panel    Component Value Date/Time   CHOL 132 05/10/2020 0845   TRIG 62 05/10/2020 0845   HDL 61 05/10/2020 0845   CHOLHDL 2.2 05/10/2020 0845   LDLCALC 58 05/10/2020 0845   LDLDIRECT 50 06/23/2021 1457    Home Medications   Current Meds  Medication Sig   acetaminophen (TYLENOL) 500 MG tablet Take 500-1,000 mg by mouth every 6 (six) hours as needed for moderate pain or headache.   amLODipine (NORVASC) 2.5 MG tablet Take 1 tablet (2.5 mg total) by mouth at bedtime.   aspirin EC 81 MG tablet Take 1 tablet (81 mg total) by mouth daily. Swallow whole.   Cholecalciferol (VITAMIN D-3) 25 MCG (1000 UT) CAPS Take 1,000 Units by mouth daily.   clopidogrel (PLAVIX) 75 MG tablet Take 1 tablet (75 mg total) by mouth daily.   metoprolol succinate (TOPROL XL) 25 MG 24 hr tablet Take 0.5 tablets (12.5 mg total) by mouth daily.   nitroGLYCERIN (NITROSTAT) 0.4 MG SL tablet Place 1 tablet  (0.4 mg total) under the tongue every 5 (five) minutes as needed for chest pain.   pantoprazole (PROTONIX) 40 MG tablet Take 40 mg by mouth daily.   [DISCONTINUED] rosuvastatin (CRESTOR) 40 MG tablet Take 1 tablet (40 mg total) by mouth daily.     Review of Systems      All other systems reviewed and are otherwise negative except as noted above.  Physical Exam    VS:  BP 104/68 (BP Location: Left Arm, Patient Position: Sitting, Cuff Size: Normal)   Pulse (!) 58   Ht '5\' 9"'$  (1.753 m)   Wt 163 lb 8 oz (74.2 kg)   BMI 24.14 kg/m  , BMI Body mass index is 24.14 kg/m.  Wt Readings from Last 3 Encounters:  11/17/21 163 lb 8 oz (74.2 kg)  07/24/21 163 lb (73.9 kg)  06/24/21 162 lb (73.5 kg)   GEN: Well nourished, well developed, in no acute distress. HEENT: normal. Neck: Supple, no JVD, carotid bruits, or masses. Cardiac: RRR, no  murmurs, rubs, or gallops. No clubbing, cyanosis, edema.  Radials/PT 2+ and equal bilaterally.  Respiratory:  Respirations regular and unlabored, clear to auscultation bilaterally. GI: Soft, nontender, nondistended. MS: No deformity or atrophy. Skin: Warm and dry, no rash. Neuro:  Strength and sensation are intact. Psych: Normal affect.  Assessment & Plan    CAD / Palpitations -PCI 01/22/2020 due to CTO of proximal LAD. 06/24/21 LHC 95% restenosis of LAD treated with scoring balloon angioplasty.  GDMT includes aspirin, Plavix, Metoprolol, Rosuvastatin. Heart healthy diet and regular cardiovascular exercise encouraged.  Walking up to 4 miles without exertional dyspnea, chest discomfort.  2-week history of occasional left-sided chest twinge more consistent with PVC/PAC.  Notes not drinking much water, recent stressors. Symptoms overall improving. EKG today no acute ST/T wave changes. We will add amlodipine 2.5 mg nightly to treat any possible coronary vasospasm. Hopeful will tolerate low dose in the evening given relative hypotension. If symptoms are persistent at  follow up, consider ZIO and lexiscan.   HLD, LDL goal less than 70 - 06/23/21 LDL 50. Continue rosuvastatin 20 mg daily.    Disposition: Follow up 1 week as scheduled with Loel Dubonnet, NP. If symptoms resolved, could delay to 4 mos f/u with Evalina Field, MD or APP.  Signed, Loel Dubonnet, NP 11/17/2021, 10:07 AM [place]

## 2021-11-17 NOTE — Patient Instructions (Signed)
Medication Instructions:  Your physician has recommended you make the following change in your medication:   Start: amlodipine 2.'5mg'$  daily at bedtime- if this starts to make you feel better by early next week we can cancel your follow up patient.   *If you need a refill on your cardiac medications before your next appointment, please call your pharmacy*  Follow-Up: At [facility], you and your health needs are our priority.  As part of our continuing mission to provide you with exceptional heart care, we have created designated Provider Care Teams.  These Care Teams include your primary Cardiologist (physician) and Advanced Practice Providers (APPs -  Physician Assistants and Nurse Practitioners) who all work together to provide you with the care you need, when you need it.  We recommend signing up for the patient portal called "MyChart".  Sign up information is provided on this After Visit Summary.  MyChart is used to connect with patients for Virtual Visits (Telemedicine).  Patients are able to view lab/test results, encounter notes, upcoming appointments, etc.  Non-urgent messages can be sent to your provider as well.   To learn more about what you can do with MyChart, go to [email or web address].    Your next appointment:   Follow up as scheduled   Other Instructions To prevent palpitations: Make sure you are adequately hydrated.  Avoid and/or limit caffeine containing beverages like soda or tea. Exercise regularly.  Manage stress well. Some over the counter medications can cause palpitations such as Benadryl, AdvilPM, TylenolPM. Regular Advil or Tylenol do not cause palpitations.   Exercise recommendations: The American Heart Association recommends 150 minutes of moderate intensity exercise weekly. Try 30 minutes of moderate intensity exercise 4-5 times per week. This could include walking, jogging, or swimming.  Heart Healthy Diet Recommendations: A low-salt diet is  recommended. Meats should be grilled, baked, or boiled. Avoid fried foods. Focus on lean protein sources like fish or chicken with vegetables and fruits. The American Heart Association is a Microbiologist!  American Heart Association Diet and Lifeystyle Recommendations    Important Information About Sugar

## 2021-11-26 ENCOUNTER — Telehealth (HOSPITAL_BASED_OUTPATIENT_CLINIC_OR_DEPARTMENT_OTHER): Payer: Self-pay

## 2021-11-26 ENCOUNTER — Ambulatory Visit (HOSPITAL_BASED_OUTPATIENT_CLINIC_OR_DEPARTMENT_OTHER): Payer: Medicare HMO | Admitting: Family

## 2021-11-26 DIAGNOSIS — Z4801 Encounter for change or removal of surgical wound dressing: Secondary | ICD-10-CM | POA: Diagnosis not present

## 2021-11-26 DIAGNOSIS — L089 Local infection of the skin and subcutaneous tissue, unspecified: Secondary | ICD-10-CM | POA: Diagnosis not present

## 2021-11-26 DIAGNOSIS — Z48817 Encounter for surgical aftercare following surgery on the skin and subcutaneous tissue: Secondary | ICD-10-CM | POA: Diagnosis not present

## 2021-11-26 NOTE — Telephone Encounter (Signed)
RN called patient to follow up to see how patient is doing since starting amlodipine. Patient states he if feeling much better, so we will cancel his appointment today and reschedule him for 3 months. He is now scheduled 12/8 at 10:55am with Laurann Montana, NP. Patient was appreciative of the call.

## 2021-12-02 DIAGNOSIS — M47816 Spondylosis without myelopathy or radiculopathy, lumbar region: Secondary | ICD-10-CM | POA: Diagnosis not present

## 2021-12-08 DIAGNOSIS — C44622 Squamous cell carcinoma of skin of right upper limb, including shoulder: Secondary | ICD-10-CM | POA: Diagnosis not present

## 2021-12-31 DIAGNOSIS — M47816 Spondylosis without myelopathy or radiculopathy, lumbar region: Secondary | ICD-10-CM | POA: Diagnosis not present

## 2022-01-17 ENCOUNTER — Other Ambulatory Visit (HOSPITAL_BASED_OUTPATIENT_CLINIC_OR_DEPARTMENT_OTHER): Payer: Self-pay | Admitting: Family

## 2022-01-17 DIAGNOSIS — I25118 Atherosclerotic heart disease of native coronary artery with other forms of angina pectoris: Secondary | ICD-10-CM

## 2022-01-19 NOTE — Telephone Encounter (Signed)
Rx(s) sent to pharmacy electronically.  

## 2022-01-30 DIAGNOSIS — M47816 Spondylosis without myelopathy or radiculopathy, lumbar region: Secondary | ICD-10-CM | POA: Diagnosis not present

## 2022-02-19 DIAGNOSIS — M47816 Spondylosis without myelopathy or radiculopathy, lumbar region: Secondary | ICD-10-CM | POA: Diagnosis not present

## 2022-02-27 ENCOUNTER — Ambulatory Visit (HOSPITAL_BASED_OUTPATIENT_CLINIC_OR_DEPARTMENT_OTHER): Payer: Medicare HMO | Admitting: Family

## 2022-02-27 ENCOUNTER — Encounter (HOSPITAL_BASED_OUTPATIENT_CLINIC_OR_DEPARTMENT_OTHER): Payer: Self-pay | Admitting: Family

## 2022-02-27 VITALS — BP 112/70 | HR 61 | Ht 69.0 in | Wt 164.0 lb

## 2022-02-27 DIAGNOSIS — I25118 Atherosclerotic heart disease of native coronary artery with other forms of angina pectoris: Secondary | ICD-10-CM

## 2022-02-27 DIAGNOSIS — E785 Hyperlipidemia, unspecified: Secondary | ICD-10-CM

## 2022-02-27 DIAGNOSIS — Z01812 Encounter for preprocedural laboratory examination: Secondary | ICD-10-CM | POA: Diagnosis not present

## 2022-02-27 NOTE — Patient Instructions (Signed)
Medication Instructions:  Continue your current medications.   *If you need a refill on your cardiac medications before your next appointment, please call your pharmacy*  Lab Work: Your physician recommends that you return for lab work today: BMP, CBC  If you have labs (blood work) drawn today and your tests are completely normal, you will receive your results only by: MyChart Message (if you have MyChart) OR A paper copy in the mail If you have any lab test that is abnormal or we need to change your treatment, we will call you to review the results.  Testing/Procedures: Your physician has requested that you have a cardiac catheterization. Cardiac catheterization is used to diagnose and/or treat various heart conditions. Doctors may recommend this procedure for a number of different reasons. The most common reason is to evaluate chest pain. Chest pain can be a symptom of coronary artery disease (CAD), and cardiac catheterization can show whether plaque is narrowing or blocking your heart's arteries. This procedure is also used to evaluate the valves, as well as measure the blood flow and oxygen levels in different parts of your heart. Please follow instruction sheet, as given.  Your EKG today showed sinus bradycardia which is stable compared to previous.  Follow-Up: At [facility], you and your health needs are our priority.  As part of our continuing mission to provide you with exceptional heart care, we have created designated Provider Care Teams.  These Care Teams include your primary Cardiologist (physician) and Advanced Practice Providers (APPs -  Physician Assistants and Nurse Practitioners) who all work together to provide you with the care you need, when you need it.  We recommend signing up for the patient portal called "MyChart".  Sign up information is provided on this After Visit Summary.  MyChart is used to connect with patients for Virtual Visits (Telemedicine).  Patients  are able to view lab/test results, encounter notes, upcoming appointments, etc.  Non-urgent messages can be sent to your provider as well.   To learn more about what you can do with MyChart, go to [email or web address].    Your next appointment:   2-3 week(s) after cardiac catheterization  The format for your next appointment:   In Person  Provider:   Eleonore Chiquito, MD or Laurann Montana, NP    Other Instructions  [place] [id number] Dept: [place]  02/27/2022  You are scheduled for a Cardiac Catheterization on Wednesday, December 13 with Dr. Glenetta Hew.  1. Please arrive at the [facility] (Main Entrance A) at [facility]: [address] [place], [place] [address] at 6:30 AM (This time is two hours before your procedure to ensure your preparation). Free valet parking service is available.   Special note: Every effort is made to have your procedure done on time. Please understand that emergencies sometimes delay scheduled procedures.  2. Diet: Do not eat solid foods after midnight.  The patient may have clear liquids until 5am upon the day of the procedure.  3. Labs: You will need to have blood drawn today at the [facility] office.  4. Medication instructions in preparation for your procedure:   Contrast Allergy: No  On the morning of your procedure, take your Aspirin 81 mg and Plavix/Clopidogrel and any morning medicines NOT listed above.  You may use sips of water.  5. Plan for one night stay--bring personal belongings. 6. Bring a current list  of your medications and current insurance cards. 7. You MUST have a responsible person to drive you home. 8. Someone MUST be with you the first 24 hours after you arrive home or your discharge will be delayed. 9. Please wear clothes that are easy to get on and off and wear slip-on  shoes.  Thank you for allowing [place] to care for you!   -- [facility]

## 2022-02-27 NOTE — Progress Notes (Signed)
Office Visit    Patient Name: Marcus Hopkins Date of Encounter: 02/27/2022  PCP:  Lavone Orn, [place]  Cardiologist:  Evalina Field, MD  Advanced Practice Provider:  No care team member to display Electrophysiologist:  None      Chief Complaint    Marcus Hopkins is a 81 y.o. male presents today for chest discomfort  Past Medical History    Past Medical History:  Diagnosis Date   Allergic rhinitis    Borderline hyperlipidemia    Cataracts, bilateral    Hemorrhoids    Insomnia    Past Surgical History:  Procedure Laterality Date   CHOLECYSTECTOMY     CORONARY BALLOON ANGIOPLASTY N/A 06/24/2021   Procedure: CORONARY BALLOON ANGIOPLASTY;  Surgeon: Jettie Booze, MD;  Location: [facility];  Service: Cardiovascular;  Laterality: N/A;   CORONARY CTO INTERVENTION N/A 01/22/2020   Procedure: CORONARY CTO INTERVENTION;  Surgeon: Leonie Man, MD;  Location: [facility];  Service: Cardiovascular;  Laterality: N/A;   ESOPHAGOGASTRODUODENOSCOPY  11/29/2011   Procedure: ESOPHAGOGASTRODUODENOSCOPY (EGD);  Surgeon: Arta Silence, MD;  Location: [facility];  Service: Endoscopy;  Laterality: N/A;   INTRAVASCULAR ULTRASOUND/IVUS N/A 06/24/2021   Procedure: Intravascular Ultrasound/IVUS;  Surgeon: Jettie Booze, MD;  Location: [facility];  Service: Cardiovascular;  Laterality: N/A;   LEFT HEART CATH AND CORONARY ANGIOGRAPHY N/A 01/22/2020   Procedure: LEFT HEART CATH AND CORONARY ANGIOGRAPHY;  Surgeon: Leonie Man, MD;  Location: [facility];  Service: Cardiovascular;  Laterality: N/A;   LEFT HEART CATH AND CORONARY ANGIOGRAPHY N/A 06/24/2021   Procedure: LEFT HEART CATH AND CORONARY ANGIOGRAPHY;  Surgeon: Jettie Booze, MD;  Location: [facility];  Service: Cardiovascular;  Laterality: N/A;   ROTATOR CUFF REPAIR      Allergies  No Known Allergies  History of Present Illness     Marcus Hopkins is a 81 y.o. male with a hx of CAD s/p PCI (01/2020 PCI to proximal LAD in setting of CTO, 06/2021 scoring balloon angioplasty to ISR of LAD), hyperlipidemia last seen 11/17/2021  01/2020 PCI to prox LAD in setting of CTO. At follow up  12/17/2020  had completed 56-monthcourse of DAPT and LDL was 58 which is at goal of less than 70.  He was doing well from a cardiac perspective and recommended to follow-up in a year. However seen 06/14/21 noted right sided chest discomfort with ambulation that was different than anginal equivalent. Metoprolol succinate 12.'5mg'$  QD was added due to known distal LAD disease. At follow up 06/23/21 noted persistent chest pain that was relieved with nitroglycerin but was still right sided. As such, was set up for LHC. Underwent cardiac catheterization 06/21/21 revealing 95% restenosis of mid LAD treated with scoring balloon angioplasty.Recommended for DAPT Brilinta and aspirin for 1 month then transition to Plavix. Ultimately long term plan for Plavix monotherapy.   At follow up 07/24/21 doing well from cardiac perspective with no chest pain, dyspnea. Brilinta was transitioned to Plavix.   Seen 11/09/2021 for chest discomfort described as a "twinge "that was on and off.  He was started on amlodipine 2.5 mg nightly.  He presents today for follow-up. 4 day history of left chest wall "twinges". Tells me it felt similar to his previous anginal symptoms in 2021. Occurs with or without exertion. No exertional dyspnea, lightheadedness, orthopnea, edema.  None recent stressors as his wife is  pending neck surgery after fall back in August.  EKGs/Labs/Other Studies Reviewed:   The following studies were reviewed today:  TTE 02/02/2020  1. LVEF is approximately 50% with hypokinesis of the distal anterior,  distal lateral and apical walls.   2. Right ventricular systolic function is normal. The right ventricular  size is normal.   3. Trivial mitral valve regurgitation.    LHC 01/22/2020   Severe single-vessel disease with 100% CTO of the LAD just after 2 diagonal branches (1st & 2nd diag) and 1st Sep branch; otherwise no significant CAD. Successful CTO PCI of LAD with antegrade wiring.  Somewhat difficult procedure with difficult wiring, stepwise balloon angioplasty and 2 overlapping stents required. Brief interval of hypotension just prior to stent deployment, responded to short term IV Levophed + 250 mL NS bolus.   Diagnostic Dominance: Right Intervention     EKG:  EKG is ordered today. EKG today demonstrates SB 58 bpm with first degree AV block PR 226 with no acute ST/T wave changes.   Recent Labs: 06/23/2021: BUN 12; Creatinine, Ser 1.06; Hemoglobin 15.9; Platelets 270; Potassium 4.4; Sodium 138  Recent Lipid Panel    Component Value Date/Time   CHOL 132 05/10/2020 0845   TRIG 62 05/10/2020 0845   HDL 61 05/10/2020 0845   CHOLHDL 2.2 05/10/2020 0845   LDLCALC 58 05/10/2020 0845   LDLDIRECT 50 06/23/2021 1457    Home Medications   Current Meds  Medication Sig   acetaminophen (TYLENOL) 500 MG tablet Take 500-1,000 mg by mouth every 6 (six) hours as needed for moderate pain or headache.   amLODipine (NORVASC) 2.5 MG tablet TAKE 1 TABLET BY MOUTH AT BEDTIME   aspirin EC 81 MG tablet Take 1 tablet (81 mg total) by mouth daily. Swallow whole.   Cholecalciferol (VITAMIN D-3) 25 MCG (1000 UT) CAPS Take 1,000 Units by mouth daily.   clopidogrel (PLAVIX) 75 MG tablet Take 1 tablet (75 mg total) by mouth daily.   metoprolol succinate (TOPROL XL) 25 MG 24 hr tablet Take 0.5 tablets (12.5 mg total) by mouth daily.   pantoprazole (PROTONIX) 40 MG tablet Take 40 mg by mouth daily.   rosuvastatin (CRESTOR) 40 MG tablet Take 1 tablet (40 mg total) by mouth daily.     Review of Systems      All other systems reviewed and are otherwise negative except as noted above.  Physical Exam    VS:  Ht '5\' 9"'$  (1.753 m)   Wt 164 lb (74.4 kg)   BMI 24.22 kg/m   , BMI Body mass index is 24.22 kg/m.  Wt Readings from Last 3 Encounters:  02/27/22 164 lb (74.4 kg)  11/17/21 163 lb 8 oz (74.2 kg)  07/24/21 163 lb (73.9 kg)   GEN: Well nourished, well developed, in no acute distress. HEENT: normal. Neck: Supple, no JVD, carotid bruits, or masses. Cardiac: RRR, no murmurs, rubs, or gallops. No clubbing, cyanosis, edema.  Radials/PT 2+ and equal bilaterally.  Respiratory:  Respirations regular and unlabored, clear to auscultation bilaterally. GI: Soft, nontender, nondistended. MS: No deformity or atrophy. Skin: Warm and dry, no rash. Neuro:  Strength and sensation are intact. Psych: Normal affect.  Assessment & Plan    CAD / Palpitations -PCI 01/22/2020 due to CTO of proximal LAD. 06/24/21 LHC 95% restenosis of LAD treated with scoring balloon angioplasty.  GDMT includes aspirin, Plavix, Metoprolol, Rosuvastatin.  Previous visit in August with angina which resolved with amlodipine 2.5 mg daily.  However, over  the past week he has had recurrent left-sided chest "twinges "that are similar to his anginal equivalent.  We discussed Lexiscan Myoview versus cardiac catheterization he prefers to proceed with more definitive evaluation with cardiac catheterization.  Hesitant to further increase amlodipine or metoprolol due to relatively low blood pressure and heart rate today.  EKG today no acute ST/T wave changes.   Shared Decision Making/Informed Consent The risks [stroke (1 in 1000), death (1 in 1000), kidney failure [usually temporary] (1 in 500), bleeding (1 in 200), allergic reaction [possibly serious] (1 in 200)], benefits (diagnostic support and management of coronary artery disease) and alternatives of a cardiac catheterization were discussed in detail with Mr. Drapeau and he is willing to proceed.   HLD, LDL goal less than 70 - 06/23/21 LDL 50. Continue rosuvastatin 20 mg daily.    Disposition: Follow up 2 to 3 weeks after cardiac catheterization with Evalina Field, MD or APP.  Signed, Loel Dubonnet, NP 02/27/2022, 10:54 AM [place]

## 2022-02-27 NOTE — H&P (View-Only) (Signed)
Office Visit    Patient Name: Marcus Hopkins Date of Encounter: 02/27/2022  PCP:  Lavone Orn, [place]  Cardiologist:  Evalina Field, MD  Advanced Practice Provider:  No care team member to display Electrophysiologist:  None      Chief Complaint    Marcus Hopkins is a 81 y.o. male presents today for chest discomfort  Past Medical History    Past Medical History:  Diagnosis Date   Allergic rhinitis    Borderline hyperlipidemia    Cataracts, bilateral    Hemorrhoids    Insomnia    Past Surgical History:  Procedure Laterality Date   CHOLECYSTECTOMY     CORONARY BALLOON ANGIOPLASTY N/A 06/24/2021   Procedure: CORONARY BALLOON ANGIOPLASTY;  Surgeon: Jettie Booze, MD;  Location: [facility];  Service: Cardiovascular;  Laterality: N/A;   CORONARY CTO INTERVENTION N/A 01/22/2020   Procedure: CORONARY CTO INTERVENTION;  Surgeon: Leonie Man, MD;  Location: [facility];  Service: Cardiovascular;  Laterality: N/A;   ESOPHAGOGASTRODUODENOSCOPY  11/29/2011   Procedure: ESOPHAGOGASTRODUODENOSCOPY (EGD);  Surgeon: Arta Silence, MD;  Location: [facility];  Service: Endoscopy;  Laterality: N/A;   INTRAVASCULAR ULTRASOUND/IVUS N/A 06/24/2021   Procedure: Intravascular Ultrasound/IVUS;  Surgeon: Jettie Booze, MD;  Location: [facility];  Service: Cardiovascular;  Laterality: N/A;   LEFT HEART CATH AND CORONARY ANGIOGRAPHY N/A 01/22/2020   Procedure: LEFT HEART CATH AND CORONARY ANGIOGRAPHY;  Surgeon: Leonie Man, MD;  Location: [place] [facility];  Service: Cardiovascular;  Laterality: N/A;   LEFT HEART CATH AND CORONARY ANGIOGRAPHY N/A 06/24/2021   Procedure: LEFT HEART CATH AND CORONARY ANGIOGRAPHY;  Surgeon: Jettie Booze, MD;  Location: [place] [facility];  Service: Cardiovascular;  Laterality: N/A;   ROTATOR CUFF REPAIR      Allergies  No Known Allergies  History of Present Illness     Marcus Hopkins is a 81 y.o. male with a hx of CAD s/p PCI (01/2020 PCI to proximal LAD in setting of CTO, 06/2021 scoring balloon angioplasty to ISR of LAD), hyperlipidemia last seen 11/17/2021  01/2020 PCI to prox LAD in setting of CTO. At follow up  12/17/2020  had completed 66-monthcourse of DAPT and LDL was 58 which is at goal of less than 70.  He was doing well from a cardiac perspective and recommended to follow-up in a year. However seen 06/14/21 noted right sided chest discomfort with ambulation that was different than anginal equivalent. Metoprolol succinate 12.'5mg'$  QD was added due to known distal LAD disease. At follow up 06/23/21 noted persistent chest pain that was relieved with nitroglycerin but was still right sided. As such, was set up for LHC. Underwent cardiac catheterization 06/21/21 revealing 95% restenosis of mid LAD treated with scoring balloon angioplasty.Recommended for DAPT Brilinta and aspirin for 1 month then transition to Plavix. Ultimately long term plan for Plavix monotherapy.   At follow up 07/24/21 doing well from cardiac perspective with no chest pain, dyspnea. Brilinta was transitioned to Plavix.   Seen 11/09/2021 for chest discomfort described as a "twinge "that was on and off.  He was started on amlodipine 2.5 mg nightly.  He presents today for follow-up. 4 day history of left chest wall "twinges". Tells me it felt similar to his previous anginal symptoms in 2021. Occurs with or without exertion. No exertional dyspnea, lightheadedness, orthopnea, edema.  None recent stressors as his wife is  pending neck surgery after fall back in August.  EKGs/Labs/Other Studies Reviewed:   The following studies were reviewed today:  TTE 02/02/2020  1. LVEF is approximately 50% with hypokinesis of the distal anterior,  distal lateral and apical walls.   2. Right ventricular systolic function is normal. The right ventricular  size is normal.   3. Trivial mitral valve regurgitation.    LHC 01/22/2020   Severe single-vessel disease with 100% CTO of the LAD just after 2 diagonal branches (1st & 2nd diag) and 1st Sep branch; otherwise no significant CAD. Successful CTO PCI of LAD with antegrade wiring.  Somewhat difficult procedure with difficult wiring, stepwise balloon angioplasty and 2 overlapping stents required. Brief interval of hypotension just prior to stent deployment, responded to short term IV Levophed + 250 mL NS bolus.   Diagnostic Dominance: Right Intervention     EKG:  EKG is ordered today. EKG today demonstrates SB 58 bpm with first degree AV block PR 226 with no acute ST/T wave changes.   Recent Labs: 06/23/2021: BUN 12; Creatinine, Ser 1.06; Hemoglobin 15.9; Platelets 270; Potassium 4.4; Sodium 138  Recent Lipid Panel    Component Value Date/Time   CHOL 132 05/10/2020 0845   TRIG 62 05/10/2020 0845   HDL 61 05/10/2020 0845   CHOLHDL 2.2 05/10/2020 0845   LDLCALC 58 05/10/2020 0845   LDLDIRECT 50 06/23/2021 1457    Home Medications   Current Meds  Medication Sig   acetaminophen (TYLENOL) 500 MG tablet Take 500-1,000 mg by mouth every 6 (six) hours as needed for moderate pain or headache.   amLODipine (NORVASC) 2.5 MG tablet TAKE 1 TABLET BY MOUTH AT BEDTIME   aspirin EC 81 MG tablet Take 1 tablet (81 mg total) by mouth daily. Swallow whole.   Cholecalciferol (VITAMIN D-3) 25 MCG (1000 UT) CAPS Take 1,000 Units by mouth daily.   clopidogrel (PLAVIX) 75 MG tablet Take 1 tablet (75 mg total) by mouth daily.   metoprolol succinate (TOPROL XL) 25 MG 24 hr tablet Take 0.5 tablets (12.5 mg total) by mouth daily.   pantoprazole (PROTONIX) 40 MG tablet Take 40 mg by mouth daily.   rosuvastatin (CRESTOR) 40 MG tablet Take 1 tablet (40 mg total) by mouth daily.     Review of Systems      All other systems reviewed and are otherwise negative except as noted above.  Physical Exam    VS:  Ht '5\' 9"'$  (1.753 m)   Wt 164 lb (74.4 kg)   BMI 24.22 kg/m   , BMI Body mass index is 24.22 kg/m.  Wt Readings from Last 3 Encounters:  02/27/22 164 lb (74.4 kg)  11/17/21 163 lb 8 oz (74.2 kg)  07/24/21 163 lb (73.9 kg)   GEN: Well nourished, well developed, in no acute distress. HEENT: normal. Neck: Supple, no JVD, carotid bruits, or masses. Cardiac: RRR, no murmurs, rubs, or gallops. No clubbing, cyanosis, edema.  Radials/PT 2+ and equal bilaterally.  Respiratory:  Respirations regular and unlabored, clear to auscultation bilaterally. GI: Soft, nontender, nondistended. MS: No deformity or atrophy. Skin: Warm and dry, no rash. Neuro:  Strength and sensation are intact. Psych: Normal affect.  Assessment & Plan    CAD / Palpitations -PCI 01/22/2020 due to CTO of proximal LAD. 06/24/21 LHC 95% restenosis of LAD treated with scoring balloon angioplasty.  GDMT includes aspirin, Plavix, Metoprolol, Rosuvastatin.  Previous visit in August with angina which resolved with amlodipine 2.5 mg daily.  However, over  the past week he has had recurrent left-sided chest "twinges "that are similar to his anginal equivalent.  We discussed Lexiscan Myoview versus cardiac catheterization he prefers to proceed with more definitive evaluation with cardiac catheterization.  Hesitant to further increase amlodipine or metoprolol due to relatively low blood pressure and heart rate today.  EKG today no acute ST/T wave changes.   Shared Decision Making/Informed Consent The risks [stroke (1 in 1000), death (1 in 1000), kidney failure [usually temporary] (1 in 500), bleeding (1 in 200), allergic reaction [possibly serious] (1 in 200)], benefits (diagnostic support and management of coronary artery disease) and alternatives of a cardiac catheterization were discussed in detail with Marcus Hopkins and he is willing to proceed.   HLD, LDL goal less than 70 - 06/23/21 LDL 50. Continue rosuvastatin 20 mg daily.    Disposition: Follow up 2 to 3 weeks after cardiac catheterization with Evalina Field, MD or APP.  Signed, Loel Dubonnet, NP 02/27/2022, 10:54 AM [place]

## 2022-02-28 LAB — BASIC METABOLIC PANEL
BUN/Creatinine Ratio: 15 (ref 10–24)
BUN: 15 mg/dL (ref 8–27)
CO2: 22 mmol/L (ref 20–29)
Calcium: 9.4 mg/dL (ref 8.6–10.2)
Chloride: 101 mmol/L (ref 96–106)
Creatinine, Ser: 1.03 mg/dL (ref 0.76–1.27)
Glucose: 97 mg/dL (ref 70–99)
Potassium: 4.1 mmol/L (ref 3.5–5.2)
Sodium: 138 mmol/L (ref 134–144)
eGFR: 74 mL/min/{1.73_m2} (ref >59)

## 2022-02-28 LAB — CBC
Hematocrit: 46.2 % (ref 37.5–51.0)
Hemoglobin: 15.7 g/dL (ref 13.0–17.7)
MCH: 31.7 pg (ref 26.6–33.0)
MCHC: 34 g/dL (ref 31.5–35.7)
MCV: 93 fL (ref 79–97)
Platelets: 279 10*3/uL (ref 150–450)
RBC: 4.95 x10E6/uL (ref 4.14–5.80)
RDW: 12.5 % (ref 11.6–15.4)
WBC: 5.6 10*3/uL (ref 3.4–10.8)

## 2022-03-02 ENCOUNTER — Telehealth: Payer: Self-pay | Admitting: *Deleted

## 2022-03-02 NOTE — Telephone Encounter (Signed)
Cardiac Catheterization scheduled at [facility] for: Wednesday March 04, 2022 8:30 AM Arrival time and place: [place] Entrance A at: 6:30 AM  Nothing to eat after midnight prior to procedure, clear liquids until 5 AM day of procedure.  Medication instructions: -Usual morning medications can be taken with sips of water including aspirin 81 mg and Plavix 75 mg.  Confirmed patient has responsible adult to drive home post procedure and be with patient first 24 hours after arriving home.  Patient reports no new symptoms concerning for COVID-19 in the past 10 days.  Reviewed procedure instructions with patient.

## 2022-03-04 ENCOUNTER — Ambulatory Visit (HOSPITAL_COMMUNITY)
Admission: RE | Admit: 2022-03-04 | Discharge: 2022-03-04 | Disposition: A | Discharge status: Home / Self Care | Payer: Medicare HMO | Attending: Cardiology | Admitting: Cardiology

## 2022-03-04 ENCOUNTER — Encounter (HOSPITAL_COMMUNITY): Payer: Self-pay | Admitting: Cardiology

## 2022-03-04 ENCOUNTER — Encounter (HOSPITAL_COMMUNITY)
Admission: RE | Disposition: A | Discharge status: Home / Self Care | Payer: Self-pay | Source: Home / Self Care | Attending: Cardiology

## 2022-03-04 DIAGNOSIS — I25118 Atherosclerotic heart disease of native coronary artery with other forms of angina pectoris: Secondary | ICD-10-CM

## 2022-03-04 DIAGNOSIS — Z79899 Other long term (current) drug therapy: Secondary | ICD-10-CM | POA: Diagnosis not present

## 2022-03-04 DIAGNOSIS — I2089 Other forms of angina pectoris: Secondary | ICD-10-CM | POA: Diagnosis not present

## 2022-03-04 DIAGNOSIS — T82855A Stenosis of coronary artery stent, initial encounter: Secondary | ICD-10-CM | POA: Diagnosis not present

## 2022-03-04 DIAGNOSIS — Z7982 Long term (current) use of aspirin: Secondary | ICD-10-CM | POA: Diagnosis not present

## 2022-03-04 DIAGNOSIS — Y832 Surgical operation with anastomosis, bypass or graft as the cause of abnormal reaction of the patient, or of later complication, without mention of misadventure at the time of the procedure: Secondary | ICD-10-CM | POA: Diagnosis not present

## 2022-03-04 DIAGNOSIS — Z955 Presence of coronary angioplasty implant and graft: Secondary | ICD-10-CM | POA: Diagnosis not present

## 2022-03-04 DIAGNOSIS — E785 Hyperlipidemia, unspecified: Secondary | ICD-10-CM | POA: Diagnosis not present

## 2022-03-04 DIAGNOSIS — I25119 Atherosclerotic heart disease of native coronary artery with unspecified angina pectoris: Secondary | ICD-10-CM | POA: Diagnosis not present

## 2022-03-04 DIAGNOSIS — Z7902 Long term (current) use of antithrombotics/antiplatelets: Secondary | ICD-10-CM | POA: Diagnosis not present

## 2022-03-04 HISTORY — PX: LEFT HEART CATH AND CORONARY ANGIOGRAPHY: CATH118249

## 2022-03-04 HISTORY — PX: INTRAVASCULAR PRESSURE WIRE/FFR STUDY: CATH118243

## 2022-03-04 LAB — POCT ACTIVATED CLOTTING TIME: Activated Clotting Time: 277 seconds

## 2022-03-04 SURGERY — LEFT HEART CATH AND CORONARY ANGIOGRAPHY
Anesthesia: LOCAL

## 2022-03-04 MED ORDER — MIDAZOLAM HCL 2 MG/2ML IJ SOLN
INTRAMUSCULAR | Status: DC | PRN
  Administered 2022-03-04: 1 mg via INTRAVENOUS

## 2022-03-04 MED ORDER — LABETALOL HCL 5 MG/ML IV SOLN: 10.0000 mg | INTRAVENOUS | Status: DC | PRN

## 2022-03-04 MED ORDER — ONDANSETRON HCL 4 MG/2ML IJ SOLN: 4.0000 mg | Freq: Four times a day (QID) | INTRAMUSCULAR | Status: DC | PRN

## 2022-03-04 MED ORDER — LIDOCAINE HCL (PF) 1 % IJ SOLN
INTRAMUSCULAR | Status: DC | PRN
  Administered 2022-03-04: 2 mL via INTRADERMAL

## 2022-03-04 MED ORDER — ADENOSINE (DIAGNOSTIC) 140MCG/KG/MIN
INTRAVENOUS | Status: DC | PRN
  Administered 2022-03-04: 140 ug/kg/min via INTRAVENOUS

## 2022-03-04 MED ORDER — SODIUM CHLORIDE 0.9 % WEIGHT BASED INFUSION: 1.0000 mL/kg/h | INTRAVENOUS | Status: DC

## 2022-03-04 MED ORDER — ADENOSINE 12 MG/4ML IV SOLN
INTRAVENOUS | Status: AC
  Filled 2022-03-04: qty 16

## 2022-03-04 MED ORDER — VERAPAMIL HCL 2.5 MG/ML IV SOLN
INTRAVENOUS | Status: DC | PRN
  Administered 2022-03-04: 10 mL via INTRA_ARTERIAL

## 2022-03-04 MED ORDER — SODIUM CHLORIDE 0.9 % IV SOLN: 250.0000 mL | INTRAVENOUS | Status: DC | PRN

## 2022-03-04 MED ORDER — HEPARIN SODIUM (PORCINE) 1000 UNIT/ML IJ SOLN
INTRAMUSCULAR | Status: AC
  Filled 2022-03-04: qty 10

## 2022-03-04 MED ORDER — SODIUM CHLORIDE 0.9% FLUSH: 3.0000 mL | Freq: Two times a day (BID) | INTRAVENOUS | Status: DC

## 2022-03-04 MED ORDER — HEPARIN (PORCINE) IN NACL 1000-0.9 UT/500ML-% IV SOLN
INTRAVENOUS | Status: AC
  Filled 2022-03-04: qty 1000

## 2022-03-04 MED ORDER — SODIUM CHLORIDE 0.9 % WEIGHT BASED INFUSION
3.0000 mL/kg/h | INTRAVENOUS | Status: AC
  Administered 2022-03-04: 3 mL/kg/h via INTRAVENOUS

## 2022-03-04 MED ORDER — HEPARIN SODIUM (PORCINE) 1000 UNIT/ML IJ SOLN
INTRAMUSCULAR | Status: DC | PRN
  Administered 2022-03-04: 3500 [IU] via INTRAVENOUS
  Administered 2022-03-04: 4000 [IU] via INTRAVENOUS

## 2022-03-04 MED ORDER — HYDRALAZINE HCL 20 MG/ML IJ SOLN: 10.0000 mg | INTRAMUSCULAR | Status: DC | PRN

## 2022-03-04 MED ORDER — HEPARIN (PORCINE) IN NACL 1000-0.9 UT/500ML-% IV SOLN
INTRAVENOUS | Status: DC | PRN
  Administered 2022-03-04 (×2): 500 mL

## 2022-03-04 MED ORDER — SODIUM CHLORIDE 0.9 % IV SOLN: INTRAVENOUS | Status: AC

## 2022-03-04 MED ORDER — ASPIRIN 81 MG PO CHEW: 81.0000 mg | CHEWABLE_TABLET | ORAL | Status: DC

## 2022-03-04 MED ORDER — MIDAZOLAM HCL 2 MG/2ML IJ SOLN
INTRAMUSCULAR | Status: AC
  Filled 2022-03-04: qty 2

## 2022-03-04 MED ORDER — VERAPAMIL HCL 2.5 MG/ML IV SOLN
INTRAVENOUS | Status: AC
  Filled 2022-03-04: qty 2

## 2022-03-04 MED ORDER — LIDOCAINE HCL (PF) 1 % IJ SOLN
INTRAMUSCULAR | Status: AC
  Filled 2022-03-04: qty 30

## 2022-03-04 MED ORDER — IOHEXOL 350 MG/ML SOLN
INTRAVENOUS | Status: DC | PRN
  Administered 2022-03-04: 80 mL

## 2022-03-04 MED ORDER — SODIUM CHLORIDE 0.9% FLUSH: 3.0000 mL | INTRAVENOUS | Status: DC | PRN

## 2022-03-04 MED ORDER — FENTANYL CITRATE (PF) 100 MCG/2ML IJ SOLN
INTRAMUSCULAR | Status: AC
  Filled 2022-03-04: qty 2

## 2022-03-04 MED ORDER — FENTANYL CITRATE (PF) 100 MCG/2ML IJ SOLN
INTRAMUSCULAR | Status: DC | PRN
  Administered 2022-03-04: 25 ug via INTRAVENOUS

## 2022-03-04 MED ORDER — ACETAMINOPHEN 325 MG PO TABS: 650.0000 mg | ORAL_TABLET | ORAL | Status: DC | PRN

## 2022-03-04 SURGICAL SUPPLY — 14 items
CATH LAUNCHER 6FR EBU3.5 (CATHETERS) IMPLANT
CATH OPTITORQUE TIG 4.0 5F (CATHETERS) IMPLANT
DEVICE RAD COMP TR BAND LRG (VASCULAR PRODUCTS) IMPLANT
GLIDESHEATH SLEND SS 6F .021 (SHEATH) IMPLANT
GUIDEWIRE INQWIRE 1.5J.035X260 (WIRE) IMPLANT
GUIDEWIRE PRESSURE X 175 (WIRE) IMPLANT
INQWIRE 1.5J .035X260CM (WIRE) ×1
KIT ESSENTIALS PG (KITS) IMPLANT
KIT HEART LEFT (KITS) ×2 IMPLANT
PACK CARDIAC CATHETERIZATION (CUSTOM PROCEDURE TRAY) ×2 IMPLANT
SHEATH PROBE COVER 6X72 (BAG) IMPLANT
TRANSDUCER W/STOPCOCK (MISCELLANEOUS) ×2 IMPLANT
TUBING CIL FLEX 10 FLL-RA (TUBING) ×2 IMPLANT
WIRE HI TORQ VERSACORE-J 145CM (WIRE) IMPLANT

## 2022-03-04 NOTE — Progress Notes (Signed)
Received report from Gerlach, RN at this time.

## 2022-03-04 NOTE — Discharge Instructions (Signed)

## 2022-03-04 NOTE — Interval H&P Note (Signed)
History and Physical Interval Note:  03/04/2022 8:24 AM  Marcus Hopkins  has presented today for surgery, with the diagnosis of angina, CAD.  The various methods of treatment have been discussed with the patient and family. After consideration of risks, benefits and other options for treatment, the patient has consented to  Procedure(s): LEFT HEART CATH AND CORONARY ANGIOGRAPHY (N/A)  PERCUTANEOUS CORONARY INTERVENTION  as a surgical intervention.  The patient's history has been reviewed, patient examined, no change in status, stable for surgery.  I have reviewed the patient's chart and labs.  Questions were answered to the patient's satisfaction.    Cath Lab Visit (complete for each Cath Lab visit)  Clinical Evaluation Leading to the Procedure:   ACS: No.  Non-ACS:    Anginal Classification: CCS II  Anti-ischemic medical therapy: Maximal Therapy (2 or more classes of medications)  Non-Invasive Test Results: No non-invasive testing performed  Prior CABG: No previous CABG    Glenetta Hew

## 2022-03-04 NOTE — Progress Notes (Signed)
TR band removed, new dressing placed arm brace in place. No further ozzing or bleeding noted or any hematoma. Will continue to monitor.

## 2022-03-09 DIAGNOSIS — D1801 Hemangioma of skin and subcutaneous tissue: Secondary | ICD-10-CM | POA: Diagnosis not present

## 2022-03-09 DIAGNOSIS — L814 Other melanin hyperpigmentation: Secondary | ICD-10-CM | POA: Diagnosis not present

## 2022-03-09 DIAGNOSIS — L570 Actinic keratosis: Secondary | ICD-10-CM | POA: Diagnosis not present

## 2022-03-09 DIAGNOSIS — Z08 Encounter for follow-up examination after completed treatment for malignant neoplasm: Secondary | ICD-10-CM | POA: Diagnosis not present

## 2022-03-09 DIAGNOSIS — L821 Other seborrheic keratosis: Secondary | ICD-10-CM | POA: Diagnosis not present

## 2022-03-09 DIAGNOSIS — Z85828 Personal history of other malignant neoplasm of skin: Secondary | ICD-10-CM | POA: Diagnosis not present

## 2022-03-10 DIAGNOSIS — M47816 Spondylosis without myelopathy or radiculopathy, lumbar region: Secondary | ICD-10-CM | POA: Diagnosis not present

## 2022-03-27 ENCOUNTER — Ambulatory Visit (HOSPITAL_BASED_OUTPATIENT_CLINIC_OR_DEPARTMENT_OTHER): Payer: Medicare Other | Admitting: Family

## 2022-03-27 ENCOUNTER — Encounter (HOSPITAL_BASED_OUTPATIENT_CLINIC_OR_DEPARTMENT_OTHER): Payer: Self-pay | Admitting: Family

## 2022-03-27 VITALS — BP 104/64 | HR 62 | Ht 69.0 in | Wt 164.0 lb

## 2022-03-27 DIAGNOSIS — E785 Hyperlipidemia, unspecified: Secondary | ICD-10-CM

## 2022-03-27 DIAGNOSIS — I25118 Atherosclerotic heart disease of native coronary artery with other forms of angina pectoris: Secondary | ICD-10-CM | POA: Diagnosis not present

## 2022-03-27 NOTE — Patient Instructions (Addendum)
Medication Instructions:  May try Melatonin as needed 30 minutes prior to bedtime for sleep. Try the '5mg'$  tablet and if that does not work take two tablets for a '10mg'$  dose. *If you need a refill on your cardiac medications before your next appointment, please call your pharmacy*  Follow-Up: At [facility], you and your health needs are our priority.  As part of our continuing mission to provide you with exceptional heart care, we have created designated Provider Care Teams.  These Care Teams include your primary Cardiologist (physician) and Advanced Practice Providers (APPs -  Physician Assistants and Nurse Practitioners) who all work together to provide you with the care you need, when you need it.  We recommend signing up for the patient portal called "MyChart".  Sign up information is provided on this After Visit Summary.  MyChart is used to connect with patients for Virtual Visits (Telemedicine).  Patients are able to view lab/test results, encounter notes, upcoming appointments, etc.  Non-urgent messages can be sent to your provider as well.   To learn more about what you can do with MyChart, go to [email or web address].    Your next appointment:   6 month(s)  The format for your next appointment:   In Person  Provider:   Evalina Field, MD  or Laurann Montana, NP     Other Instructions

## 2022-03-27 NOTE — Progress Notes (Signed)
Office Visit    Patient Name: Marcus Hopkins Date of Encounter: 03/27/2022  PCP:  Lavone Orn, [place]  Cardiologist:  Evalina Field, MD  Advanced Practice Provider:  No care team member to display Electrophysiologist:  None      Chief Complaint    Marcus Hopkins is a 82 y.o. male presents today for chest discomfort  Past Medical History    Past Medical History:  Diagnosis Date   Allergic rhinitis    Borderline hyperlipidemia    Cataracts, bilateral    Hemorrhoids    Insomnia    Past Surgical History:  Procedure Laterality Date   CHOLECYSTECTOMY     CORONARY BALLOON ANGIOPLASTY N/A 06/24/2021   Procedure: CORONARY BALLOON ANGIOPLASTY;  Surgeon: Jettie Booze, MD;  Location: [place] [facility];  Service: Cardiovascular;  Laterality: N/A;   CORONARY CTO INTERVENTION N/A 01/22/2020   Procedure: CORONARY CTO INTERVENTION;  Surgeon: Leonie Man, MD;  Location: [place] [facility];  Service: Cardiovascular;  Laterality: N/A;   ESOPHAGOGASTRODUODENOSCOPY  11/29/2011   Procedure: ESOPHAGOGASTRODUODENOSCOPY (EGD);  Surgeon: Arta Silence, MD;  Location: [facility];  Service: Endoscopy;  Laterality: N/A;   INTRAVASCULAR PRESSURE WIRE/FFR STUDY N/A 03/04/2022   Procedure: INTRAVASCULAR PRESSURE WIRE/FFR STUDY;  Surgeon: Leonie Man, MD;  Location: [place] [facility];  Service: Cardiovascular;  Laterality: N/A;   INTRAVASCULAR ULTRASOUND/IVUS N/A 06/24/2021   Procedure: Intravascular Ultrasound/IVUS;  Surgeon: Jettie Booze, MD;  Location: [place] [facility];  Service: Cardiovascular;  Laterality: N/A;   LEFT HEART CATH AND CORONARY ANGIOGRAPHY N/A 01/22/2020   Procedure: LEFT HEART CATH AND CORONARY ANGIOGRAPHY;  Surgeon: Leonie Man, MD;  Location: [place] [facility];  Service: Cardiovascular;  Laterality: N/A;   LEFT HEART CATH AND CORONARY ANGIOGRAPHY N/A 06/24/2021   Procedure: LEFT HEART CATH AND CORONARY  ANGIOGRAPHY;  Surgeon: Jettie Booze, MD;  Location: [place] [facility];  Service: Cardiovascular;  Laterality: N/A;   LEFT HEART CATH AND CORONARY ANGIOGRAPHY N/A 03/04/2022   Procedure: LEFT HEART CATH AND CORONARY ANGIOGRAPHY;  Surgeon: Leonie Man, MD;  Location: [place];  Service: Cardiovascular;  Laterality: N/A;   ROTATOR CUFF REPAIR      Allergies  No Known Allergies  History of Present Illness    Marcus Hopkins is a 82 y.o. male with a hx of CAD s/p PCI (01/2020 PCI to proximal LAD in setting of CTO, 06/2021 scoring balloon angioplasty to ISR of LAD), hyperlipidemia last seen for cardiac catheterization.  01/2020 PCI to prox LAD in setting of CTO. At follow up  12/17/2020  had completed 68-monthcourse of DAPT and LDL was 58 which is at goal of less than 70.  He was doing well from a cardiac perspective and recommended to follow-up in a year. However seen 06/14/21 noted right sided chest discomfort with ambulation that was different than anginal equivalent. Metoprolol succinate 12.'5mg'$  QD was added due to known distal LAD disease. At follow up 06/23/21 noted persistent chest pain that was relieved with nitroglycerin but was still right sided. As such, was set up for LHC. Underwent cardiac catheterization 06/21/21 revealing 95% restenosis of mid LAD treated with scoring balloon angioplasty.Recommended for DAPT Brilinta and aspirin for 1 month then transition to Plavix. Ultimately long term plan for Plavix monotherapy.   At follow up 07/24/21 doing well from cardiac perspective with no chest pain, dyspnea. Brilinta was transitioned  to Plavix.   Seen 11/09/2021 for chest discomfort described as a "twinge "that was on and off.  He was started on amlodipine 2.5 mg nightly.  Seen 02/27/22 with one week history of chest pain. LHC 03/04/2022 with mild ISR of mid LAD stent 40-50% which was insignificant by FFR with otherwise widely patent distal pression of stent and normal coronaries  throughout.  He presents today for follow up independently. He is caretaking for his wife who is still pending neck surgery after fall back in August. His wife asked him to share that he has been more tired over the 2-3 weeks. He is walking 2 miles per day for exercise without difficulty. He is getting up throughout the night multiple times which has been ongoing for some time. Discussed trial of Melatonin and to discuss with PCP. No recurrent chest pain, pressure, tightness.   EKGs/Labs/Other Studies Reviewed:   The following studies were reviewed today:  LHC 03/04/22   Mid LAD stent segment: Focal mid LAD lesion is 45% stenosed. - >  In-stent restenosis of the overlapping segment of the originally placed stents.  Nonsignificant by RFR and FFR.  Otherwise widely patent stent segment in the LAD.   Dist LAD lesion is 15% stenosed. 3rd Sept lesion is 25% stenosed. ->  Notably improved distal flow.   Prox RCA-1 lesion is 25% stenosed.  Prox RCA-2 lesion is 25% stenosed.   The left ventricular systolic function is normal.   LV end diastolic pressure is normal.   --------------------------------------------------------------   POST-CATH DIAGNOSES Mild in-stent restenosis of the mid LAD stent overlap segment roughly 40 to 50%. Marland Kitchen RFR was 0.92, FFR 0.89-not significant. The distal LAD is actually significant improved from initial PCI. No longer has lesion. Otherwise widely patent likely distal portion of the stent and normal coronary arteries throughout.  Normal LVEF and EDP.    RECOMMENDATIONS Okay for discharge home today.  Follow-up with Dr. Audie Box. Would there is a possibility of titrating up amlodipine for potential spasm or other medical management.  However with no flow-limiting lesion, would consider that the patient symptoms may not be cardiac in nature.   TTE 02/02/2020  1. LVEF is approximately 50% with hypokinesis of the distal anterior,  distal lateral and apical walls.   2. Right  ventricular systolic function is normal. The right ventricular  size is normal.   3. Trivial mitral valve regurgitation.   LHC 01/22/2020   Severe single-vessel disease with 100% CTO of the LAD just after 2 diagonal branches (1st & 2nd diag) and 1st Sep branch; otherwise no significant CAD. Successful CTO PCI of LAD with antegrade wiring.  Somewhat difficult procedure with difficult wiring, stepwise balloon angioplasty and 2 overlapping stents required. Brief interval of hypotension just prior to stent deployment, responded to short term IV Levophed + 250 mL NS bolus.   Diagnostic Dominance: Right Intervention     EKG:  EKG is  not ordered today.  Recent Labs: 02/27/2022: BUN 15; Creatinine, Ser 1.03; Hemoglobin 15.7; Platelets 279; Potassium 4.1; Sodium 138  Recent Lipid Panel    Component Value Date/Time   CHOL 132 05/10/2020 0845   TRIG 62 05/10/2020 0845   HDL 61 05/10/2020 0845   CHOLHDL 2.2 05/10/2020 0845   LDLCALC 58 05/10/2020 0845   LDLDIRECT 50 06/23/2021 1457    Home Medications   Current Meds  Medication Sig   acetaminophen (TYLENOL) 500 MG tablet Take 500-1,000 mg by mouth every 6 (six) hours as needed for  moderate pain or headache.   amLODipine (NORVASC) 2.5 MG tablet TAKE 1 TABLET BY MOUTH AT BEDTIME   aspirin EC 81 MG tablet Take 1 tablet (81 mg total) by mouth daily. Swallow whole.   Cholecalciferol (VITAMIN D-3) 25 MCG (1000 UT) CAPS Take 1,000 Units by mouth daily.   clopidogrel (PLAVIX) 75 MG tablet Take 1 tablet (75 mg total) by mouth daily.   diphenhydrAMINE (BENADRYL) 25 MG tablet Take 25 mg by mouth daily as needed for allergies.   metoprolol succinate (TOPROL XL) 25 MG 24 hr tablet Take 0.5 tablets (12.5 mg total) by mouth daily.   nitroGLYCERIN (NITROSTAT) 0.4 MG SL tablet Place 0.4 mg under the tongue every 5 (five) minutes as needed for chest pain.   pantoprazole (PROTONIX) 40 MG tablet Take 40 mg by mouth daily as needed (acid reflux).    rosuvastatin (CRESTOR) 40 MG tablet Take 1 tablet (40 mg total) by mouth daily.     Review of Systems      All other systems reviewed and are otherwise negative except as noted above.  Physical Exam    VS:  BP 104/64   Pulse 62   Ht '5\' 9"'$  (1.753 m)   Wt 164 lb (74.4 kg)   BMI 24.22 kg/m  , BMI Body mass index is 24.22 kg/m.  Wt Readings from Last 3 Encounters:  03/27/22 164 lb (74.4 kg)  03/04/22 163 lb (73.9 kg)  02/27/22 164 lb (74.4 kg)   GEN: Well nourished, well developed, in no acute distress. HEENT: normal. Neck: Supple, no JVD, carotid bruits, or masses. Cardiac: RRR, no murmurs, rubs, or gallops. No clubbing, cyanosis, edema.  Radials/PT 2+ and equal bilaterally.  Respiratory:  Respirations regular and unlabored, clear to auscultation bilaterally. GI: Soft, nontender, nondistended. MS: No deformity or atrophy. Skin: Warm and dry, no rash. Neuro:  Strength and sensation are intact. Psych: Normal affect.  Assessment & Plan    CAD  -PCI 01/22/2020 due to CTO of proximal LAD. 06/24/21 LHC 95% restenosis of LAD treated with scoring balloon angioplasty. LHC 03/04/22 moderate ISR of LAD 40-50% negative by FFR with no obstructive disease. No recurrent chest pain  GDMT includes aspirin, Plavix, Metoprolol, Rosuvastatin, Amlodipine.   HLD, LDL goal less than 70 - 06/23/21 LDL 50. Continue rosuvastatin 20 mg daily.    Disposition: Follow up in 6 months with Evalina Field, MD or APP.  Signed, Loel Dubonnet, NP 03/27/2022, 11:05 AM [place]

## 2022-03-30 DIAGNOSIS — E559 Vitamin D deficiency, unspecified: Secondary | ICD-10-CM | POA: Diagnosis not present

## 2022-03-30 DIAGNOSIS — Z79899 Other long term (current) drug therapy: Secondary | ICD-10-CM | POA: Diagnosis not present

## 2022-03-30 DIAGNOSIS — Z0000 Encounter for general adult medical examination without abnormal findings: Secondary | ICD-10-CM | POA: Diagnosis not present

## 2022-03-30 DIAGNOSIS — E7800 Pure hypercholesterolemia, unspecified: Secondary | ICD-10-CM | POA: Diagnosis not present

## 2022-03-30 DIAGNOSIS — I2510 Atherosclerotic heart disease of native coronary artery without angina pectoris: Secondary | ICD-10-CM | POA: Diagnosis not present

## 2022-03-30 DIAGNOSIS — Z1331 Encounter for screening for depression: Secondary | ICD-10-CM | POA: Diagnosis not present

## 2022-03-30 DIAGNOSIS — I700 Atherosclerosis of aorta: Secondary | ICD-10-CM | POA: Diagnosis not present

## 2022-05-06 DIAGNOSIS — M47816 Spondylosis without myelopathy or radiculopathy, lumbar region: Secondary | ICD-10-CM | POA: Diagnosis not present

## 2022-06-05 ENCOUNTER — Other Ambulatory Visit (HOSPITAL_BASED_OUTPATIENT_CLINIC_OR_DEPARTMENT_OTHER): Payer: Self-pay | Admitting: Family

## 2022-06-05 NOTE — Telephone Encounter (Signed)
Rx request sent to pharmacy.  

## 2022-06-08 DIAGNOSIS — M47816 Spondylosis without myelopathy or radiculopathy, lumbar region: Secondary | ICD-10-CM | POA: Diagnosis not present

## 2022-06-10 DIAGNOSIS — K080 Exfoliation of teeth due to systemic causes: Secondary | ICD-10-CM | POA: Diagnosis not present

## 2022-06-20 ENCOUNTER — Other Ambulatory Visit (HOSPITAL_BASED_OUTPATIENT_CLINIC_OR_DEPARTMENT_OTHER): Payer: Self-pay | Admitting: Family

## 2022-06-20 DIAGNOSIS — E785 Hyperlipidemia, unspecified: Secondary | ICD-10-CM

## 2022-06-20 DIAGNOSIS — I25118 Atherosclerotic heart disease of native coronary artery with other forms of angina pectoris: Secondary | ICD-10-CM

## 2022-06-22 NOTE — Telephone Encounter (Signed)
Patient of Dr. O'Neal. Please review for refill. Thank you!  

## 2022-07-15 ENCOUNTER — Other Ambulatory Visit (HOSPITAL_BASED_OUTPATIENT_CLINIC_OR_DEPARTMENT_OTHER): Payer: Self-pay | Admitting: Family

## 2022-07-15 DIAGNOSIS — I25118 Atherosclerotic heart disease of native coronary artery with other forms of angina pectoris: Secondary | ICD-10-CM

## 2022-08-18 DIAGNOSIS — R208 Other disturbances of skin sensation: Secondary | ICD-10-CM | POA: Diagnosis not present

## 2022-08-18 DIAGNOSIS — B078 Other viral warts: Secondary | ICD-10-CM | POA: Diagnosis not present

## 2022-08-18 DIAGNOSIS — Z789 Other specified health status: Secondary | ICD-10-CM | POA: Diagnosis not present

## 2022-09-03 ENCOUNTER — Other Ambulatory Visit (HOSPITAL_BASED_OUTPATIENT_CLINIC_OR_DEPARTMENT_OTHER): Payer: Self-pay | Admitting: Family

## 2022-09-03 NOTE — Telephone Encounter (Signed)
Patient of Dr. O'Neal. Please review for refill. Thank you!  

## 2022-09-10 DIAGNOSIS — L298 Other pruritus: Secondary | ICD-10-CM | POA: Diagnosis not present

## 2022-09-10 DIAGNOSIS — B078 Other viral warts: Secondary | ICD-10-CM | POA: Diagnosis not present

## 2022-09-10 DIAGNOSIS — L814 Other melanin hyperpigmentation: Secondary | ICD-10-CM | POA: Diagnosis not present

## 2022-09-10 DIAGNOSIS — R238 Other skin changes: Secondary | ICD-10-CM | POA: Diagnosis not present

## 2022-09-10 DIAGNOSIS — Z08 Encounter for follow-up examination after completed treatment for malignant neoplasm: Secondary | ICD-10-CM | POA: Diagnosis not present

## 2022-09-10 DIAGNOSIS — L538 Other specified erythematous conditions: Secondary | ICD-10-CM | POA: Diagnosis not present

## 2022-09-10 DIAGNOSIS — D225 Melanocytic nevi of trunk: Secondary | ICD-10-CM | POA: Diagnosis not present

## 2022-09-10 DIAGNOSIS — L821 Other seborrheic keratosis: Secondary | ICD-10-CM | POA: Diagnosis not present

## 2022-09-28 ENCOUNTER — Encounter (HOSPITAL_BASED_OUTPATIENT_CLINIC_OR_DEPARTMENT_OTHER): Payer: Self-pay | Admitting: Family

## 2022-09-28 ENCOUNTER — Ambulatory Visit (HOSPITAL_BASED_OUTPATIENT_CLINIC_OR_DEPARTMENT_OTHER): Payer: Medicare Other | Admitting: Family

## 2022-09-28 DIAGNOSIS — I25118 Atherosclerotic heart disease of native coronary artery with other forms of angina pectoris: Secondary | ICD-10-CM

## 2022-09-28 DIAGNOSIS — E785 Hyperlipidemia, unspecified: Secondary | ICD-10-CM | POA: Diagnosis not present

## 2022-09-28 MED ORDER — ROSUVASTATIN CALCIUM 40 MG PO TABS: 40.0000 mg | ORAL_TABLET | Freq: Every day | ORAL | 3 refills | Status: DC

## 2022-09-28 MED ORDER — CLOPIDOGREL BISULFATE 75 MG PO TABS: 75.0000 mg | ORAL_TABLET | Freq: Every day | ORAL | 3 refills | Status: DC

## 2022-09-28 MED ORDER — NITROGLYCERIN 0.4 MG SL SUBL: 0.4000 mg | SUBLINGUAL_TABLET | SUBLINGUAL | 3 refills | Status: AC | PRN

## 2022-09-28 NOTE — Patient Instructions (Signed)
Medication Instructions:  Your physician recommends that you continue on your current medications as directed. Please refer to the Current Medication list given to you today.  *If you need a refill on your cardiac medications before your next appointment, please call your pharmacy*  Follow-Up: At [facility], you and your health needs are our priority.  As part of our continuing mission to provide you with exceptional heart care, we have created designated Provider Care Teams.  These Care Teams include your primary Cardiologist (physician) and Advanced Practice Providers (APPs -  Physician Assistants and Nurse Practitioners) who all work together to provide you with the care you need, when you need it.  We recommend signing up for the patient portal called "MyChart".  Sign up information is provided on this After Visit Summary.  MyChart is used to connect with patients for Virtual Visits (Telemedicine).  Patients are able to view lab/test results, encounter notes, upcoming appointments, etc.  Non-urgent messages can be sent to your provider as well.   To learn more about what you can do with MyChart, go to [email or web address].    Your next appointment:   1 year with Dr. Flora Lipps or Gillian Shields, NP

## 2022-09-28 NOTE — Progress Notes (Signed)
Cardiology Office Note:  .   Date:  09/28/2022  ID:  Marcus Hopkins, DOB 1940/06/03, MRN [id number] PCP: Thana Ates, MD  [facility] Providers Cardiologist:  Reatha Harps, MD    History of Present Illness: .   Marcus Hopkins is a 82 y.o. male with a hx of CAD s/p PCI (01/2020 PCI to proximal LAD in setting of CTO, 06/2021 scoring balloon angioplasty to ISR of LAD), hyperlipidemia last seen 03/27/22.    01/2020 PCI to prox LAD in setting of CTO. At follow up  12/17/2020  had completed 84-month course of DAPT and LDL was 58 which is at goal of less than 70.  He was doing well from a cardiac perspective and recommended to follow-up in a year. However seen 06/14/21 noted right sided chest discomfort with ambulation that was different than anginal equivalent. Metoprolol succinate 12.5mg  QD was added due to known distal LAD disease. At follow up 06/23/21 noted persistent chest pain that was relieved with nitroglycerin but was still right sided. As such, was set up for LHC. Underwent cardiac catheterization 06/21/21 revealing 95% restenosis of mid LAD treated with scoring balloon angioplasty.Recommended for DAPT Brilinta and aspirin for 1 month then transition to Plavix. Ultimately long term plan for Plavix monotherapy.    At follow up 07/24/21 doing well and Brilinta was transitioned to Plavix. Seen 11/09/2021 for chest discomfort described as an intermittent "twinge" and was started on amlodipine 2.5 mg nightly. Seen 02/27/22 with one week history of chest pain. LHC 03/04/2022 with mild ISR of mid LAD stent 40-50% which was insignificant by FFR with otherwise widely patent distal pression of stent and normal coronaries throughout.  Last seen 03/27/22 doing well from cardiac perspective with no changes made. He noted difficulties with sleep and was encouraged to follow up with PCP.   He presents today for follow up independently.  He is walking 2 miles per day for exercise without difficulty at [facility] or [employer]. Reports no shortness of breath nor dyspnea on exertion. Reports no chest pain, pressure, or tightness. No edema, orthopnea, PND. Reports no palpitations.    ROS: Please see the history of present illness.    All other systems reviewed and are negative.   Studies Reviewed: .        Cardiac Studies & Procedures   CARDIAC CATHETERIZATION  CARDIAC CATHETERIZATION 03/04/2022  Narrative   Mid LAD stent segment: Focal mid LAD lesion is 45% stenosed. - >  In-stent restenosis of the overlapping segment of the originally placed stents.  Nonsignificant by RFR and FFR.  Otherwise widely patent stent segment in the LAD.   Dist LAD lesion is 15% stenosed. 3rd Sept lesion is 25% stenosed. ->  Notably improved distal flow.   Prox RCA-1 lesion is 25% stenosed.  Prox RCA-2 lesion is 25% stenosed.   The left ventricular systolic function is normal.   LV end diastolic pressure is normal.   --------------------------------------------------------------  POST-CATH DIAGNOSES Mild in-stent restenosis of the mid LAD stent overlap segment roughly 40 to 50%. Marland Kitchen RFR was 0.92, FFR 0.89-not significant. The distal LAD is actually significant improved from initial PCI. No longer has lesion. Otherwise widely patent likely distal portion of the stent and normal coronary arteries throughout. Normal LVEF and EDP.    RECOMMENDATIONS Okay for discharge home today.  Follow-up with Dr. Flora Lipps. Would there is a possibility of titrating up amlodipine for potential spasm or other medical management.  However with  no flow-limiting lesion, would consider that the patient symptoms may not be cardiac in nature.   Bryan Lemma, MD  Findings Coronary Findings Diagnostic  Dominance: Right  Left Main Vessel is large.  Left Anterior Descending Collaterals Dist LAD filled by collaterals from RPDA.  Mid LAD lesion is 45% stenosed. The lesion is focal, concentric and smooth. The lesion is calcified.  Focal ISR The lesion was previously treated using a drug eluting stent and angioplasty over 2 years ago. Previously placed stent displays restenosis. Pressure wire/FFR was performed on the lesion. FFR: 0.89. RFR 0.92 Dist LAD lesion is 15% stenosed. The lesion is segmental and eccentric. Actually this has the appearance of slow flow due to retrograde flow filling.  First Septal Branch  Second Diagonal Branch Vessel is small in size.  Second Septal Branch Vessel is small in size.  Third Diagonal Branch  Third Septal Branch Actually fourth diagonal 3rd Sept lesion is 25% stenosed. The lesion is eccentric and irregular.  Right Coronary Artery Vessel is large. Prox RCA-1 lesion is 25% stenosed. The lesion is focal and eccentric. Prox RCA-2 lesion is 25% stenosed. The lesion is located at the bend, focal and concentric.  Right Posterior Descending Artery Vessel is large in size.  Right Posterior Atrioventricular Artery Vessel is small in size.  Intervention  No interventions have been documented.   CARDIAC CATHETERIZATION  CARDIAC CATHETERIZATION 06/24/2021  Narrative   Dist LAD lesion is 75% stenosed.   Prox RCA-1 lesion is 25% stenosed.   Prox RCA-2 lesion is 25% stenosed.   3rd Sept lesion is 50% stenosed.   Mid LAD lesion is 95% stenosed.  This was in-stent restenosis of a 3.0 x 12 and 2.5 mm diameter stent placed in 2021, postdilated to 3.25 mm.  The in-stent restenosis began at the stent overlap and extended into the more distal stent from 2021.   Scoring balloon angioplasty was performed using a BALLN SCOREFLEX 3.0X15.  The distal stent was postdilated to 3.5 mm [place] balloon and the proximal stent was postdilated to 4 mm [place] balloon.  Intravascular ultrasound was used to optimize the angioplasty.   Post intervention, there is a 0% residual stenosis.   The left ventricular systolic function is normal.   LV end diastolic pressure is normal.   The left ventricular ejection  fraction is 55-65% by visual estimate.   There is no aortic valve stenosis.  Successful scoring balloon angioplasty of the in-stent restenosis of the previously placed LAD stents.  Brilinta started during the procedure.  Would continue Brilinta for 30 days.  Okay to switch back to clopidogrel.  Given the length of stented segment, would recommend clopidogrel monotherapy going forward.  Findings Coronary Findings Diagnostic  Dominance: Right  Left Main Vessel is large.  Left Anterior Descending Collaterals Dist LAD filled by collaterals from RPDA.  Mid LAD lesion is 95% stenosed. Vessel is the culprit lesion. The lesion is segmental and chronically occluded. The lesion was previously treated using a drug eluting stent between 1-2 years ago. Previously placed stent displays restenosis. Ultrasound (IVUS) was performed. Severe plaque burden was detected. Dist LAD lesion is 75% stenosed. The lesion is segmental and eccentric. Actually this has the appearance of slow flow due to retrograde flow filling.  First Septal Branch Collaterals 1st Sept filled by collaterals from RPDA.  Second Diagonal Branch Vessel is small in size.  Second Septal Branch Vessel is small in size. Collaterals 2nd Sept filled by collaterals from RPDA.  Third Diagonal  Branch Collaterals 3rd Diag filled by collaterals from 1st Diag.  Third Septal Branch Actually fourth diagonal 3rd Sept lesion is 50% stenosed. The lesion is eccentric and irregular.  Right Coronary Artery Vessel is large. Prox RCA-1 lesion is 25% stenosed. The lesion is focal and eccentric. Prox RCA-2 lesion is 25% stenosed. The lesion is located at the bend, focal and concentric.  Right Posterior Descending Artery Vessel is large in size.  Right Posterior Atrioventricular Artery Vessel is small in size.  Intervention  Mid LAD lesion Angioplasty CATH LAUNCHER 6FR EBU 3.75 guide catheter was inserted. WIRE ASAHI PROWATER 180CM  guidewire used to cross lesion. Scoring balloon angioplasty was performed using a BALLN SCOREFLEX 3.0X15. Maximum pressure: 18 atm. Severe in-stent restenosis.  Scoring balloon angioplasty was performed.  Intravascular ultrasound was performed showing that the distal stent could be postdilated to 3.5 mm in the proximal stent to be postdilated to 4 mm.  The stented area was postdilated with a 3.5 millimeter.  When we tried to advance the 4 mm [place] balloon, guide position was lost.  We then switched to an EBU 3.75 guide catheter for additional support.  The 4 mm balloon was successfully advanced and postdilated the proximal stent, which was a 3.0 x 12 placed in 2021.  No new stents were placed during this procedure.  Follow-up intravascular ultrasound showed no evidence of edge dissection and improved expansion of the previously placed stents. Post-Intervention Lesion Assessment The intervention was successful. Pre-interventional TIMI flow is 1. Post-intervention TIMI flow is 3. No complications occurred at this lesion. Ultrasound (IVUS) was performed on the lesion post PCI using a CATH OPTICROSS HD. There is a 0% residual stenosis post intervention.   STRESS TESTS  MYOCARDIAL PERFUSION IMAGING 01/17/2020  Narrative  The left ventricular ejection fraction is moderately decreased (30-44%).  Nuclear stress EF: 42%.  There was no ST segment deviation noted during stress.  Defect 1: There is a large defect of moderate severity present in the mid anterior, mid inferoseptal, apical anterior, apical septal and apex location.  Defect 2: There is a defect present in the apical lateral location.  Findings consistent with ischemia in the LAD territory.  This is a high risk study.   ECHOCARDIOGRAM  ECHOCARDIOGRAM COMPLETE 02/02/2020  Narrative ECHOCARDIOGRAM REPORT    Patient Name:   LORANZA LASKER Date of Exam: 02/02/2020 Medical Rec #:  [id number]        Height:       69.0 in Accession #:     [id number]       Weight:       161.8 lb Date of Birth:  1940/04/11       BSA:          1.888 m Patient Age:    78 years         BP:           106/68 mmHg Patient Gender: M                HR:           52 bpm. Exam Location:  [address]  Procedure: 2D Echo, Cardiac Doppler and Color Doppler  Indications:    R07.9* Chest pain, unspecified  History:        Patient has no prior history of Echocardiogram examinations. Abnormal stress test, Signs/Symptoms:Chest Pain; Risk Factors:Former Smoker.  Sonographer:    Samule Ohm RDCS Referring Phys: [id number] Ronnald Ramp O'NEAL  IMPRESSIONS   1. LVEF is  approximately 50% with hypokinesis of the distal anterior, distal lateral and apical walls. 2. Right ventricular systolic function is normal. The right ventricular size is normal. 3. Trivial mitral valve regurgitation.  FINDINGS Left Ventricle: Hypokinesis in the distal anterior, distal lateral and apical walls. Left ventricular ejection fraction, by estimation, is 50%%. The left ventricle has mildly decreased function. The left ventricle demonstrates regional wall motion abnormalities. The left ventricular internal cavity size was normal in size. There is no left ventricular hypertrophy. Left ventricular diastolic parameters are indeterminate.  Right Ventricle: The right ventricular size is normal. Right vetricular wall thickness was not assessed. Right ventricular systolic function is normal.  Left Atrium: Left atrial size was normal in size.  Right Atrium: Right atrial size was normal in size.  Pericardium: There is no evidence of pericardial effusion.  Mitral Valve: The mitral valve is grossly normal. Trivial mitral valve regurgitation.  Tricuspid Valve: The tricuspid valve is normal in structure. Tricuspid valve regurgitation is mild.  Aortic Valve: The aortic valve is tricuspid. Aortic valve regurgitation is trivial.  Pulmonic Valve: The pulmonic valve was grossly normal.  Pulmonic valve regurgitation is not visualized.  Aorta: The aortic root is normal in size and structure.  IAS/Shunts: No atrial level shunt detected by color flow Doppler.   LEFT VENTRICLE PLAX 2D LVIDd:         5.00 cm  Diastology LVIDs:         3.50 cm  LV e' medial:    6.64 cm/s LV PW:         0.70 cm  LV E/e' medial:  9.0 LV IVS:        0.90 cm  LV e' lateral:   9.03 cm/s LVOT diam:     2.40 cm  LV E/e' lateral: 6.6 LV SV:         89 LV SV Index:   47 LVOT Area:     4.52 cm   RIGHT VENTRICLE             IVC RV S prime:     13.70 cm/s  IVC diam: 1.00 cm TAPSE (M-mode): 2.9 cm RVSP:           24.5 mmHg  LEFT ATRIUM             Index       RIGHT ATRIUM           Index LA diam:        3.60 cm 1.91 cm/m  RA Pressure: 3.00 mmHg LA Vol (A2C):   39.1 ml 20.71 ml/m RA Area:     16.80 cm LA Vol (A4C):   29.1 ml 15.41 ml/m RA Volume:   48.80 ml  25.85 ml/m LA Biplane Vol: 33.4 ml 17.69 ml/m AORTIC VALVE LVOT Vmax:   105.00 cm/s LVOT Vmean:  59.400 cm/s LVOT VTI:    0.196 m  AORTA Ao Root diam: 3.60 cm Ao Asc diam:  3.40 cm  MV E velocity: 59.50 cm/s  TRICUSPID VALVE MV A velocity: 90.00 cm/s  TR Peak grad:   21.5 mmHg MV E/A ratio:  0.66        TR Vmax:        232.00 cm/s Estimated RAP:  3.00 mmHg RVSP:           24.5 mmHg  SHUNTS Systemic VTI:  0.20 m Systemic Diam: 2.40 cm  Dietrich Pates MD Electronically signed by Dietrich Pates MD Signature Date/Time: 02/02/2020/6:22:37 PM  Final             Risk Assessment/Calculations:             Physical Exam:   VS:  BP 100/68   Pulse 67   Ht 5\' 9"  (1.753 m)   Wt 160 lb (72.6 kg)   BMI 23.63 kg/m    Wt Readings from Last 3 Encounters:  09/28/22 160 lb (72.6 kg)  03/27/22 164 lb (74.4 kg)  03/04/22 163 lb (73.9 kg)    GEN: Well nourished, well developed in no acute distress NECK: No JVD; No carotid bruits CARDIAC: RRR, no murmurs, rubs, gallops RESPIRATORY:  Clear to auscultation without rales, wheezing or  rhonchi  ABDOMEN: Soft, non-tender, non-distended EXTREMITIES:  No edema; No deformity   ASSESSMENT AND PLAN: .    CAD  -PCI 01/22/2020 due to CTO of proximal LAD. 06/24/21 LHC 95% restenosis of LAD treated with scoring balloon angioplasty. LHC 03/04/22 moderate ISR of LAD 40-50% negative by FFR with no obstructive disease. No recurrent chest pain  GDMT includes aspirin, Plavix, Metoprolol, Rosuvastatin, Amlodipine. Given length of stented segment will continue DAPT but could be consolidated to Plavix monotherapy if needed in the future.  03/30/22 ALT 27, AST 26, total cholesterol 141, HDL 72, LDL 55, triglycerides 67   HLD, LDL goal less than 70 - 06/23/21 LDL 50. 03/2022 LDL 55. Continue rosuvastatin 20 mg daily. Refill provided.  Denies myalgias.        Dispo: follow up in one year with Dr. Flora Lipps  Signed, Alver Sorrow, NP

## 2022-11-19 DIAGNOSIS — G3184 Mild cognitive impairment, so stated: Secondary | ICD-10-CM | POA: Diagnosis not present

## 2022-11-19 DIAGNOSIS — F419 Anxiety disorder, unspecified: Secondary | ICD-10-CM | POA: Diagnosis not present

## 2022-11-19 DIAGNOSIS — R413 Other amnesia: Secondary | ICD-10-CM | POA: Diagnosis not present

## 2022-11-20 ENCOUNTER — Other Ambulatory Visit: Payer: Self-pay | Admitting: Internal Medicine

## 2022-11-20 DIAGNOSIS — G3184 Mild cognitive impairment, so stated: Secondary | ICD-10-CM

## 2022-12-04 ENCOUNTER — Encounter: Payer: Self-pay | Admitting: Physician Assistant

## 2022-12-08 DIAGNOSIS — L309 Dermatitis, unspecified: Secondary | ICD-10-CM | POA: Diagnosis not present

## 2022-12-26 ENCOUNTER — Other Ambulatory Visit (HOSPITAL_BASED_OUTPATIENT_CLINIC_OR_DEPARTMENT_OTHER): Payer: Self-pay | Admitting: Family

## 2022-12-26 DIAGNOSIS — I25118 Atherosclerotic heart disease of native coronary artery with other forms of angina pectoris: Secondary | ICD-10-CM

## 2022-12-26 DIAGNOSIS — E785 Hyperlipidemia, unspecified: Secondary | ICD-10-CM

## 2023-01-01 ENCOUNTER — Encounter: Payer: Self-pay | Admitting: Internal Medicine

## 2023-01-06 ENCOUNTER — Ambulatory Visit
Admission: RE | Admit: 2023-01-06 | Discharge: 2023-01-06 | Disposition: A | Discharge status: Home / Self Care | Payer: Medicare Other | Source: Ambulatory Visit | Attending: Internal Medicine | Admitting: Internal Medicine

## 2023-01-06 DIAGNOSIS — G3184 Mild cognitive impairment, so stated: Secondary | ICD-10-CM

## 2023-01-21 ENCOUNTER — Encounter: Payer: Self-pay | Admitting: Physician Assistant

## 2023-02-22 ENCOUNTER — Ambulatory Visit
Admission: RE | Admit: 2023-02-22 | Discharge: 2023-02-22 | Disposition: A | Discharge status: Home / Self Care | Payer: Medicare Other | Source: Ambulatory Visit | Attending: Internal Medicine | Admitting: Internal Medicine

## 2023-02-22 DIAGNOSIS — R413 Other amnesia: Secondary | ICD-10-CM | POA: Diagnosis not present

## 2023-02-22 MED ORDER — GADOPICLENOL 0.5 MMOL/ML IV SOLN
7.5000 mL | Freq: Once | INTRAVENOUS | Status: AC | PRN
  Administered 2023-02-22: 7.5 mL via INTRAVENOUS

## 2023-02-28 NOTE — Progress Notes (Signed)
Assessment/Plan:   Marcus Hopkins is a very pleasant 82 y.o. year old RH male with a history of hypertension, hyperlipidemia,CAD,  sinus bradycardia seen today for evaluation of memory loss. MoCA today is 25/30.Marland Kitchen MRI brain personally reviewed from 02/22/2023 was remarkable for mild for age volume loss, no acute abnormalities.  There is a small developmental venous anomaly in the right frontal lobe, otherwise no other findings.  Workup is in progress.  Patient is able to participate on his IADLs and continues to drive without significant difficulties.     Memory Impairment of unclear etiology   Neurocognitive testing to further evaluate cognitive concerns and determine other underlying cause of memory changes, including potential contribution from sleep, anxiety, attention, or depression  Start memantine 5 mg bid, side effects discussed (bradycardia per prior EKG) Check B12, TSH Continue to control mood as per PCP Recommend good control of cardiovascular risk factors [place] up in 3 months  Subjective:   The patient is accompanied by his wife  who supplements the history.   How long did patient have memory difficulties?  For the last 2 years, worse over  the last 6 months .  Reports some difficulty remembering new information, conversations and names.  It takes him longer to process. Long-term memory is good , wife disagrees. repeats oneself?  Endorsed Disoriented when walking into a room?  Patient denies except occasionally not remembering what patient came to the room for    Leaving objects in unusual places? Denies.   Wandering behavior?  denies .  Any personality changes? Irritable when reminded of memory issues  Any history of depression?:  Has some anxiety, denies depression   Hallucinations or paranoia?  Denies   Seizures?  Denies    Any sleep changes?  Does not sleep great, goes back after reading or watching TV. Denies vivid dreams, REM behavior or sleepwalking   Sleep  apnea?  Denies   Any hygiene concerns?  Denies   Independent of bathing and dressing?  Endorsed  Does the patient needs help with medications? Patient is in charge   Who is in charge of the finances? Patient is in charge     Any changes in appetite?  Denies     Patient have trouble swallowing? Denies.   Does the patient cook?  Not much. No kitchen accidents.  Any history of headaches?   Denies.   Chronic pain ? Chronic arthritis in the back, takes shots  Ambulates with difficulty?  Denies.   Recent falls or head injuries? Denies.   Vision changes? Denies.   Unilateral weakness, numbness or tingling? Denies.   Any tremors?   Denies.   Any anosmia?  Denies.   Any incontinence of urine? Denies.   Any bowel dysfunction? Denies.      Patient lives with wife History of heavy alcohol intake? Denies.   History of heavy tobacco use? Denies.   Family history of dementia? Father had dementia ? Type  Does patient drive? Yes, denies any issues  Retired from [employer]  Past Medical History:  Diagnosis Date   Allergic rhinitis    Borderline hyperlipidemia    Cataracts, bilateral    Hemorrhoids    Insomnia      Past Surgical History:  Procedure Laterality Date   CHOLECYSTECTOMY     CORONARY BALLOON ANGIOPLASTY N/A 06/24/2021   Procedure: CORONARY BALLOON ANGIOPLASTY;  Surgeon: Corky Crafts, MD;  Location: [facility] INVASIVE CV LAB;  Service: Cardiovascular;  Laterality: N/A;  CORONARY CTO INTERVENTION N/A 01/22/2020   Procedure: CORONARY CTO INTERVENTION;  Surgeon: Marykay Lex, MD;  Location: [facility] INVASIVE CV LAB;  Service: Cardiovascular;  Laterality: N/A;   CORONARY PRESSURE/FFR STUDY N/A 03/04/2022   Procedure: INTRAVASCULAR PRESSURE WIRE/FFR STUDY;  Surgeon: Marykay Lex, MD;  Location: [facility] INVASIVE CV LAB;  Service: Cardiovascular;  Laterality: N/A;   CORONARY ULTRASOUND/IVUS N/A 06/24/2021   Procedure: Intravascular Ultrasound/IVUS;  Surgeon: Corky Crafts, MD;  Location: [facility]  INVASIVE CV LAB;  Service: Cardiovascular;  Laterality: N/A;   ESOPHAGOGASTRODUODENOSCOPY  11/29/2011   Procedure: ESOPHAGOGASTRODUODENOSCOPY (EGD);  Surgeon: Willis Modena, MD;  Location: [facility];  Service: Endoscopy;  Laterality: N/A;   LEFT HEART CATH AND CORONARY ANGIOGRAPHY N/A 01/22/2020   Procedure: LEFT HEART CATH AND CORONARY ANGIOGRAPHY;  Surgeon: Marykay Lex, MD;  Location: [facility] INVASIVE CV LAB;  Service: Cardiovascular;  Laterality: N/A;   LEFT HEART CATH AND CORONARY ANGIOGRAPHY N/A 06/24/2021   Procedure: LEFT HEART CATH AND CORONARY ANGIOGRAPHY;  Surgeon: Corky Crafts, MD;  Location: [facility] INVASIVE CV LAB;  Service: Cardiovascular;  Laterality: N/A;   LEFT HEART CATH AND CORONARY ANGIOGRAPHY N/A 03/04/2022   Procedure: LEFT HEART CATH AND CORONARY ANGIOGRAPHY;  Surgeon: Marykay Lex, MD;  Location: [facility] INVASIVE CV LAB;  Service: Cardiovascular;  Laterality: N/A;   ROTATOR CUFF REPAIR       No Known Allergies  Current Outpatient Medications  Medication Instructions   acetaminophen (TYLENOL) 500-1,000 mg, Oral, Every 6 hours PRN   amLODipine (NORVASC) 2.5 mg, Oral, Daily at bedtime   aspirin EC 81 mg, Oral, Daily, Swallow whole.   clopidogrel (PLAVIX) 75 mg, Oral, Daily   diphenhydrAMINE (BENADRYL) 25 mg, Oral, Daily PRN   metoprolol succinate (TOPROL-XL) 25 MG 24 hr tablet TAKE ONE-HALF (0.5) TABLET BY MOUTH DAILY   nitroGLYCERIN (NITROSTAT) 0.4 mg, Sublingual, Every 5 min PRN   pantoprazole (PROTONIX) 40 mg, Oral, Daily PRN   rosuvastatin (CRESTOR) 40 mg, Oral, Daily   Vitamin D-3 1,000 Units, Oral, Daily     VITALS:   Vitals:   03/03/23 1258  Height: 5\' 9"  (1.753 m)      PHYSICAL EXAM  General NAD  looks younger than stated age  HEENT:  Normocephalic, atraumatic. The superficial temporal arteries are without ropiness or tenderness. Cardiovascular: Regular rate and rhythm. Lungs: Clear to auscultation bilaterally. Neck: There are no carotid bruits  noted bilaterally.  NEUROLOGICAL:    03/03/2023    2:00 PM  Montreal Cognitive Assessment   Visuospatial/ Executive (0/5) 2  Naming (0/3) 3  Attention: Read list of digits (0/2) 2  Attention: Read list of letters (0/1) 1  Attention: Serial 7 subtraction starting at 100 (0/3) 1  Language: Repeat phrase (0/2) 2  Language : Fluency (0/1) 1  Abstraction (0/2) 2  Delayed Recall (0/5) 5  Orientation (0/6) 6  Total 25  Adjusted Score (based on education) 25        No data to display           Orientation:  Alert and oriented to person, place and time. No aphasia or dysarthria. Fund of knowledge is appropriate. Recent memory impaired and remote memory intact.  Attention and concentration are normal.  Able to name objects and repeat phrases. Delayed recall 5 /5 Cranial nerves: There is good facial symmetry. Extraocular muscles are intact and visual fields are full to confrontational testing. Speech is fluent and clear. No tongue deviation. Hearing is intact to conversational tone.  Tone: Tone is good throughout. Sensation: Sensation is intact to light touch. Vibration is intact at the bilateral big toe.  Coordination: The patient has no difficulty with RAM's or FNF bilaterally. Normal finger to nose  Motor: Strength is 5/5 in the bilateral upper and lower extremities. There is no pronator drift. There are no fasciculations noted. DTR's: Deep tendon reflexes are 2/4 bilaterally. Gait and Station: The patient is able to ambulate without difficulty The patient is able to heel toe walk . Gait is cautious and narrow. The patient is able to ambulate in a tandem fashion.       Thank you for allowing [place] the opportunity to participate in the care of this nice patient. Please do not hesitate to contact us for any questions or concerns.   Total time spent on today's visit was 94 minutes dedicated to this patient today, preparing to see patient, examining the patient, ordering tests and/or  medications and counseling the patient, documenting clinical information in the EHR or other health record, independently interpreting results and communicating results to the patient/family, discussing treatment and goals, answering patient's questions and coordinating care.  Cc:  Thana Ates, MD  Marlowe Kays 03/03/2023 2:22 PM

## 2023-03-01 DIAGNOSIS — M47816 Spondylosis without myelopathy or radiculopathy, lumbar region: Secondary | ICD-10-CM | POA: Diagnosis not present

## 2023-03-03 ENCOUNTER — Other Ambulatory Visit: Payer: Medicare Other

## 2023-03-03 ENCOUNTER — Encounter: Payer: Self-pay | Admitting: Physician Assistant

## 2023-03-03 ENCOUNTER — Ambulatory Visit: Payer: Medicare Other | Admitting: Physician Assistant

## 2023-03-03 VITALS — Ht 69.0 in

## 2023-03-03 DIAGNOSIS — R413 Other amnesia: Secondary | ICD-10-CM | POA: Diagnosis not present

## 2023-03-03 NOTE — Patient Instructions (Addendum)
It was a pleasure to see you today at our office.   Recommendations:  Neurocognitive evaluation at our office   Check labs today  [address] Follow up in  3 months Will consider starting on a memory pill but will wait till tomorrow, when I  get the radiology report    For psychiatric meds, mood meds: Please have your primary care physician manage these medications.  If you have any severe symptoms of a stroke, or other severe issues such as confusion,severe chills or fever, etc call 911 or go to the ER as you may need to be evaluated further   For assessment of decision of mental capacity and competency:  Call Dr. Erick Blinks, geriatric psychiatrist at [phone]  Counseling regarding caregiver distress, including caregiver depression, anxiety and issues regarding community resources, adult day care programs, adult living facilities, or memory care questions:  please contact your  Primary Doctor's Social Worker   Whom to call: Memory  decline, memory medications: Call our office [phone]    [email or web address]   RECOMMENDATIONS FOR ALL PATIENTS WITH MEMORY PROBLEMS: 1. Continue to exercise (Recommend 30 minutes of walking everyday, or 3 hours every week) 2. Increase social interactions - continue going to [place] and enjoy social gatherings with friends and family 3. Eat healthy, avoid fried foods and eat more fruits and vegetables 4. Maintain adequate blood pressure, blood sugar, and blood cholesterol level. Reducing the risk of stroke and cardiovascular disease also helps promoting better memory. 5. Avoid stressful situations. Live a simple life and avoid aggravations. Organize your time and prepare for the next day in anticipation. 6. Sleep well, avoid any interruptions of sleep and avoid any distractions in the bedroom that may interfere with adequate sleep quality 7. Avoid sugar, avoid sweets as there is a strong link  between excessive sugar intake, diabetes, and cognitive impairment We discussed the Mediterranean diet, which has been shown to help patients reduce the risk of progressive memory disorders and reduces cardiovascular risk. This includes eating fish, eat fruits and green leafy vegetables, nuts like almonds and hazelnuts, walnuts, and also use olive oil. Avoid fast foods and fried foods as much as possible. Avoid sweets and sugar as sugar use has been linked to worsening of memory function.  There is always a concern of gradual progression of memory problems. If this is the case, then we may need to adjust level of care according to patient needs. Support, both to the patient and caregiver, should then be put into place.      You have been referred for a neuropsychological evaluation (i.e., evaluation of memory and thinking abilities). Please bring someone with you to this appointment if possible, as it is helpful for the doctor to hear from both you and another adult who knows you well. Please bring eyeglasses and hearing aids if you wear them.    The evaluation will take approximately 3 hours and has two parts:   The first part is a clinical interview with the neuropsychologist (Dr. Milbert Coulter or Dr. Roseanne Reno). During the interview, the neuropsychologist will speak with you and the individual you brought to the appointment.    The second part of the evaluation is testing with the doctor's technician Annabelle Harman or Selena Batten). During the testing, the technician will ask you to remember different types of material, solve problems, and answer some questionnaires. Your family member will not be present for this portion of the evaluation.   Please note: We must reserve several hours of  the neuropsychologist's time and the psychometrician's time for your evaluation appointment. As such, there is a No-Show fee of $100. If you are unable to attend any of your appointments, please contact our office as soon as possible to  reschedule.      DRIVING: Regarding driving, in patients with progressive memory problems, driving will be impaired. We advise to have someone else do the driving if trouble finding directions or if minor accidents are reported. Independent driving assessment is available to determine safety of driving.   If you are interested in the driving assessment, you can contact the following:  [facility] in [place] [phone]  [facility] [phone]  [facility] [phone]  [facility] [phone] or [phone]   FALL PRECAUTIONS: Be cautious when walking. Scan the area for obstacles that may increase the risk of trips and falls. When getting up in the mornings, sit up at the edge of the bed for a few minutes before getting out of bed. Consider elevating the bed at the head end to avoid drop of blood pressure when getting up. Walk always in a well-lit room (use night lights in the walls). Avoid area rugs or power cords from appliances in the middle of the walkways. Use a walker or a cane if necessary and consider physical therapy for balance exercise. Get your eyesight checked regularly.  FINANCIAL OVERSIGHT: Supervision, especially oversight when making financial decisions or transactions is also recommended.  HOME SAFETY: Consider the safety of the kitchen when operating appliances like stoves, microwave oven, and blender. Consider having supervision and share cooking responsibilities until no longer able to participate in those. Accidents with firearms and other hazards in the house should be identified and addressed as well.   ABILITY TO BE LEFT ALONE: If patient is unable to contact 911 operator, consider using LifeLine, or when the need is there, arrange for someone to stay with patients. Smoking is a fire hazard, consider supervision or cessation. Risk of wandering should be assessed by caregiver and if detected at any point,  supervision and safe proof recommendations should be instituted.  MEDICATION SUPERVISION: Inability to self-administer medication needs to be constantly addressed. Implement a mechanism to ensure safe administration of the medications.      Mediterranean Diet A Mediterranean diet refers to food and lifestyle choices that are based on the traditions of countries located on the [employer]. This way of eating has been shown to help prevent certain conditions and improve outcomes for people who have chronic diseases, like kidney disease and heart disease. What are tips for following this plan? Lifestyle  Cook and eat meals together with your family, when possible. Drink enough fluid to keep your urine clear or pale yellow. Be physically active every day. This includes: Aerobic exercise like running or swimming. Leisure activities like gardening, walking, or housework. Get 7-8 hours of sleep each night. If recommended by your health care provider, drink red wine in moderation. This means 1 glass a day for nonpregnant women and 2 glasses a day for men. A glass of wine equals 5 oz (150 mL). Reading food labels  Check the serving size of packaged foods. For foods such as rice and pasta, the serving size refers to the amount of cooked product, not dry. Check the total fat in packaged foods. Avoid foods that have saturated fat or trans fats. Check the ingredients list for added sugars, such as corn syrup. Shopping  At the grocery store, buy most of  your food from the areas near the walls of the store. This includes: Fresh fruits and vegetables (produce). Grains, beans, nuts, and seeds. Some of these may be available in unpackaged forms or large amounts (in bulk). Fresh seafood. Poultry and eggs. Low-fat dairy products. Buy whole ingredients instead of prepackaged foods. Buy fresh fruits and vegetables in-season from local farmers markets. Buy frozen fruits and vegetables in resealable  bags. If you do not have access to quality fresh seafood, buy precooked frozen shrimp or canned fish, such as tuna, salmon, or sardines. Buy small amounts of raw or cooked vegetables, salads, or olives from the deli or salad bar at your store. Stock your pantry so you always have certain foods on hand, such as olive oil, canned tuna, canned tomatoes, rice, pasta, and beans. Cooking  Cook foods with extra-virgin olive oil instead of using butter or other vegetable oils. Have meat as a side dish, and have vegetables or grains as your main dish. This means having meat in small portions or adding small amounts of meat to foods like pasta or stew. Use beans or vegetables instead of meat in common dishes like chili or lasagna. Experiment with different cooking methods. Try roasting or broiling vegetables instead of steaming or sauteing them. Add frozen vegetables to soups, stews, pasta, or rice. Add nuts or seeds for added healthy fat at each meal. You can add these to yogurt, salads, or vegetable dishes. Marinate fish or vegetables using olive oil, lemon juice, garlic, and fresh herbs. Meal planning  Plan to eat 1 vegetarian meal one day each week. Try to work up to 2 vegetarian meals, if possible. Eat seafood 2 or more times a week. Have healthy snacks readily available, such as: Vegetable sticks with hummus. Greek yogurt. Fruit and nut trail mix. Eat balanced meals throughout the week. This includes: Fruit: 2-3 servings a day Vegetables: 4-5 servings a day Low-fat dairy: 2 servings a day Fish, poultry, or lean meat: 1 serving a day Beans and legumes: 2 or more servings a week Nuts and seeds: 1-2 servings a day Whole grains: 6-8 servings a day Extra-virgin olive oil: 3-4 servings a day Limit red meat and sweets to only a few servings a month What are my food choices? Mediterranean diet Recommended Grains: Whole-grain pasta. Brown rice. Bulgar wheat. Polenta. Couscous. Whole-wheat bread.  Orpah Cobb. Vegetables: Artichokes. Beets. Broccoli. Cabbage. Carrots. Eggplant. Green beans. Chard. Kale. Spinach. Onions. Leeks. Peas. Squash. Tomatoes. Peppers. Radishes. Fruits: Apples. Apricots. Avocado. Berries. Bananas. Cherries. Dates. Figs. Grapes. Lemons. Melon. Oranges. Peaches. Plums. Pomegranate. Meats and other protein foods: Beans. Almonds. Sunflower seeds. Pine nuts. Peanuts. Cod. Salmon. Scallops. Shrimp. Tuna. Tilapia. Clams. Oysters. Eggs. Dairy: Low-fat milk. Cheese. Greek yogurt. Beverages: Water. Red wine. Herbal tea. Fats and oils: Extra virgin olive oil. Avocado oil. Grape seed oil. Sweets and desserts: [place] yogurt with honey. Baked apples. Poached pears. Trail mix. Seasoning and other foods: Basil. Cilantro. Coriander. Cumin. Mint. Parsley. Sage. Rosemary. Tarragon. Garlic. Oregano. Thyme. Pepper. Balsalmic vinegar. Tahini. Hummus. Tomato sauce. Olives. Mushrooms. Limit these Grains: Prepackaged pasta or rice dishes. Prepackaged cereal with added sugar. Vegetables: Deep fried potatoes (french fries). Fruits: Fruit canned in syrup. Meats and other protein foods: Beef. Pork. Lamb. Poultry with skin. Hot dogs. Tomasa Blase. Dairy: Ice cream. Sour cream. Whole milk. Beverages: Juice. Sugar-sweetened soft drinks. Beer. Liquor and spirits. Fats and oils: Butter. Canola oil. Vegetable oil. Beef fat (tallow). Lard. Sweets and desserts: Cookies. Cakes. Pies. Candy. Seasoning and other foods:  Mayonnaise. Premade sauces and marinades. The items listed may not be a complete list. Talk with your dietitian about what dietary choices are right for you. Summary The Mediterranean diet includes both food and lifestyle choices. Eat a variety of fresh fruits and vegetables, beans, nuts, seeds, and whole grains. Limit the amount of red meat and sweets that you eat. Talk with your health care provider about whether it is safe for you to drink red wine in moderation. This means 1 glass a day  for nonpregnant women and 2 glasses a day for men. A glass of wine equals 5 oz (150 mL). This information is not intended to replace advice given to you by your health care provider. Make sure you discuss any questions you have with your health care provider. Document Released: 10/31/2015 Document Revised: 12/03/2015 Document Reviewed: 10/31/2015 Elsevier Interactive Patient Education  2017 [employer].

## 2023-03-04 LAB — TSH: TSH: 2.11 m[IU]/L (ref 0.40–4.50)

## 2023-03-04 LAB — VITAMIN B12: Vitamin B-12: 398 pg/mL (ref 200–1100)

## 2023-03-04 MED ORDER — MEMANTINE HCL 5 MG PO TABS: ORAL_TABLET | ORAL | 11 refills | Status: AC

## 2023-03-04 NOTE — Progress Notes (Signed)
B12 on the lower normal at 398, recommend following with PCP and thyroid levels normal, thanks

## 2023-03-09 IMAGING — CT CT CHEST W/O CM
1 series · 16 of 34 positions shown, 20 images · non-contrast
Comparison: 03/10/2021

CLINICAL DATA: Congestion, wheezing, nodule seen on chest x-ray.

EXAM:
CT CHEST WITHOUT CONTRAST
TECHNIQUE: Multidetector CT imaging of the chest was performed following the
standard protocol without IV contrast.

[Series 2: chest w/(date) · axial · 0.76mm/px · z∈[-330,-30]mm · 16 of 170 slices shown, 20 images]
[im 13/170  mediastinal]
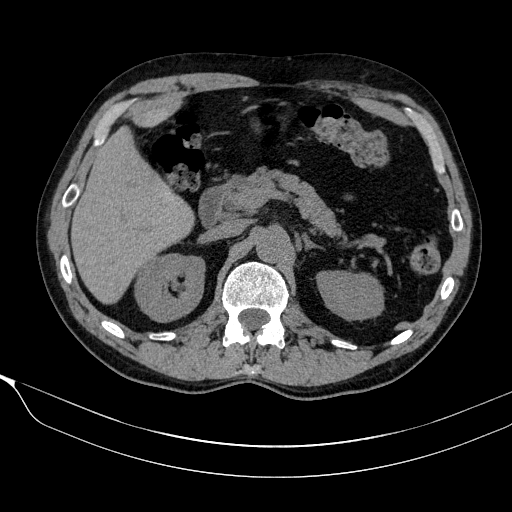
[im 13/170  lung]
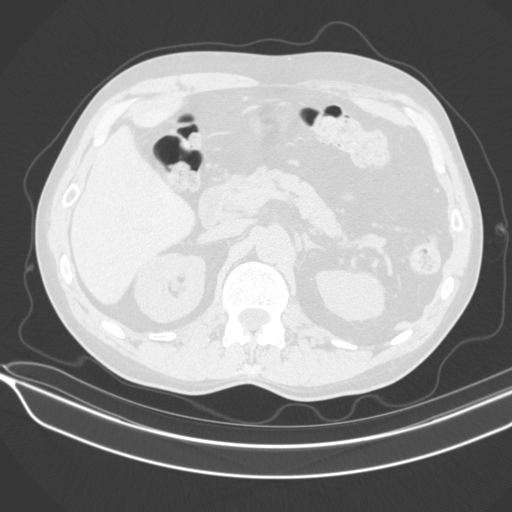
[im 26/170  lung]
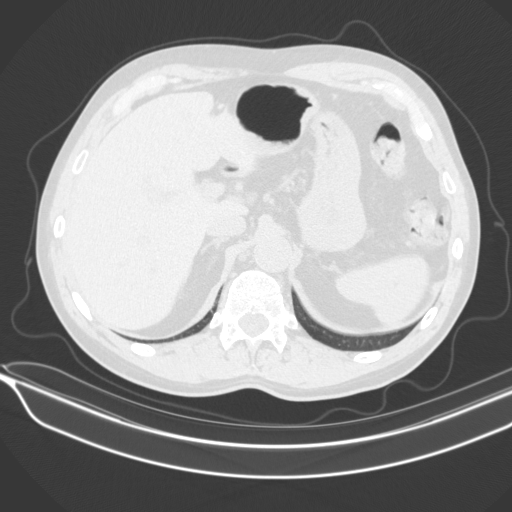
[im 34/170  lung]
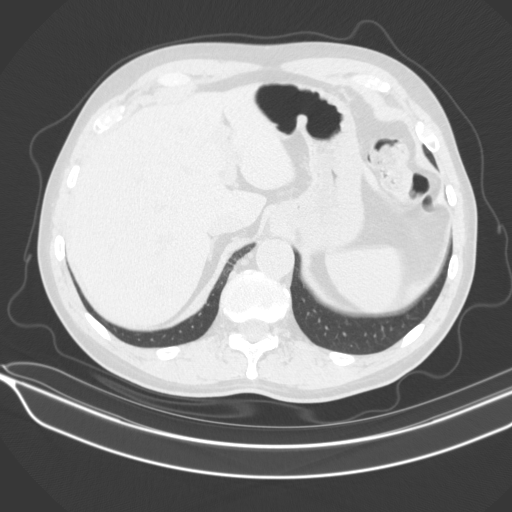
[im 44/170  lung]
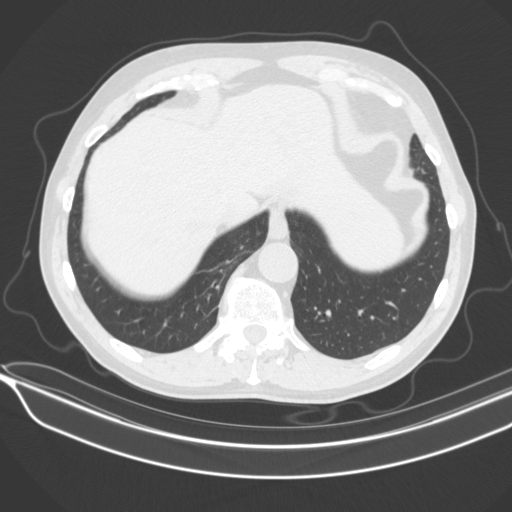
[im 57/170  mediastinal]
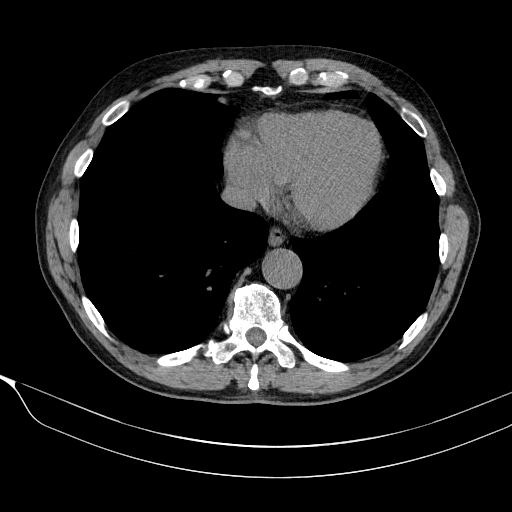
[im 57/170  lung]
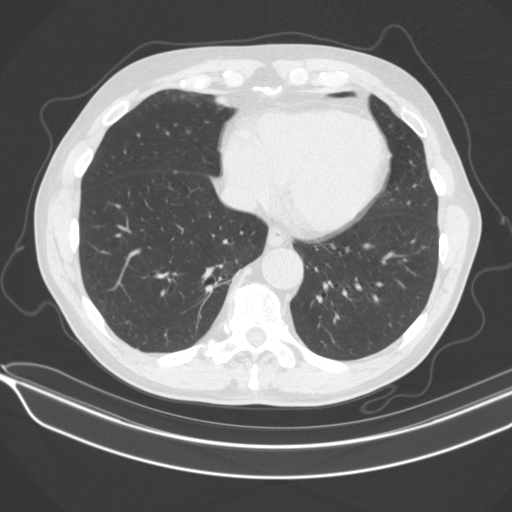
[im 68/170  lung]
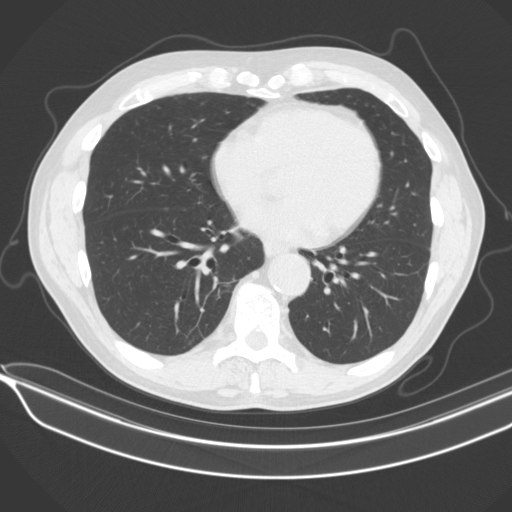
[im 76/170  lung]
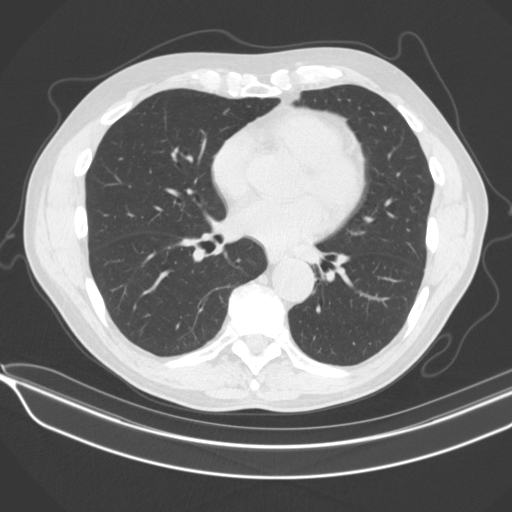
[im 82/170  lung]
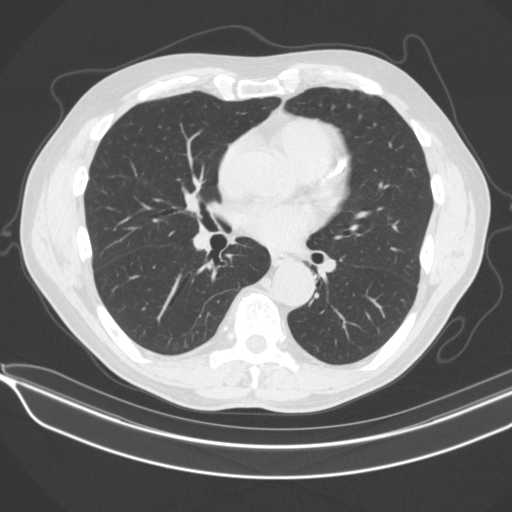
[im 90/170  mediastinal]
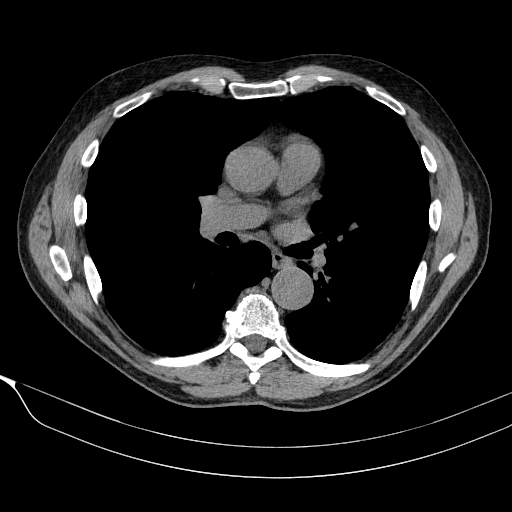
[im 90/170  lung]
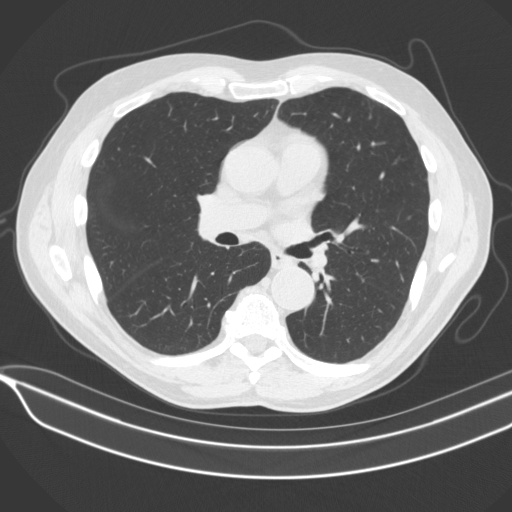
[im 101/170  lung]
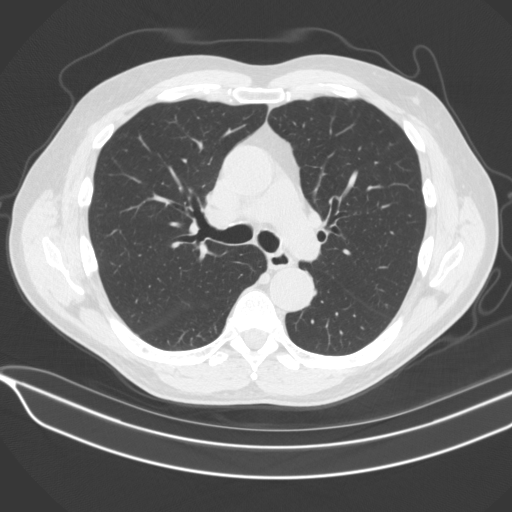
[im 107/170  lung]
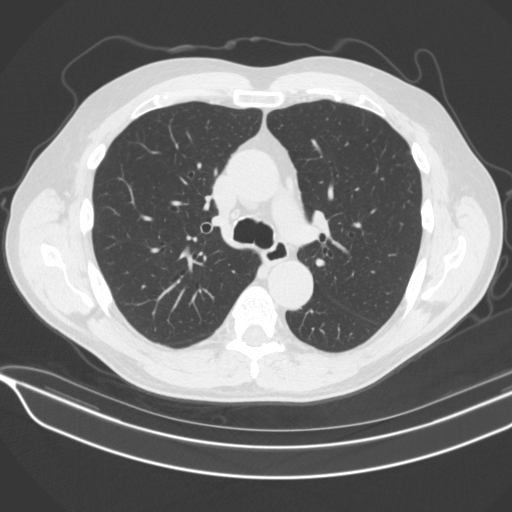
[im 119/170  lung]
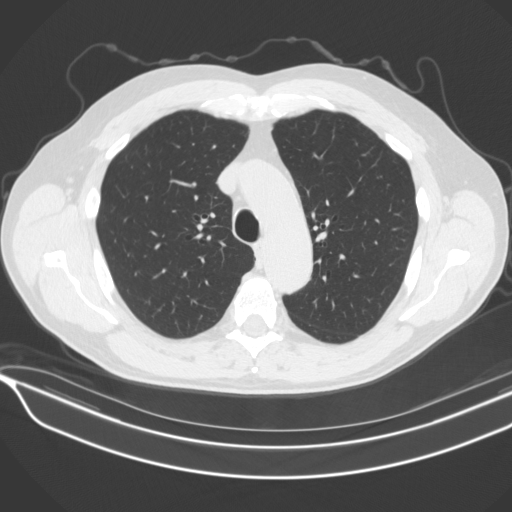
[im 132/170  mediastinal]
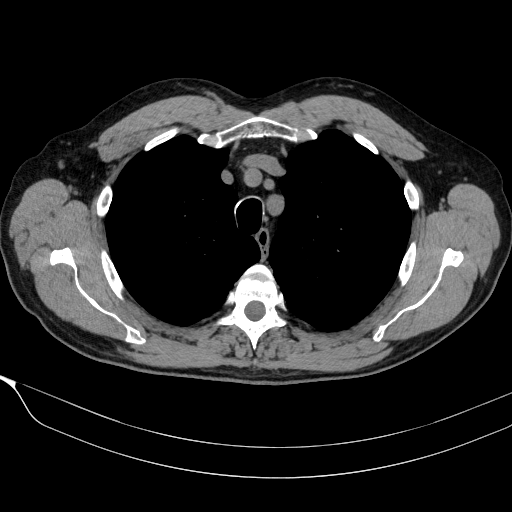
[im 132/170  lung]
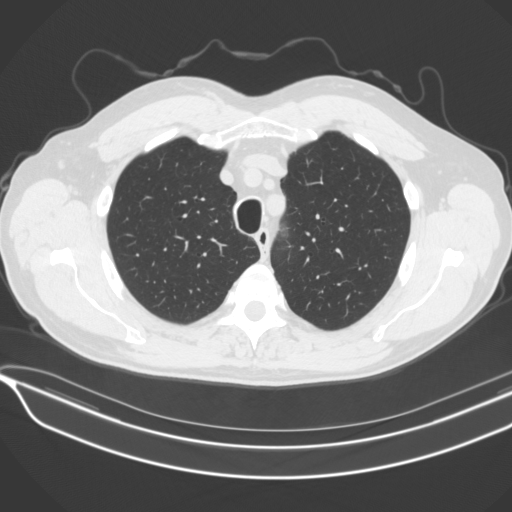
[im 138/170  lung]
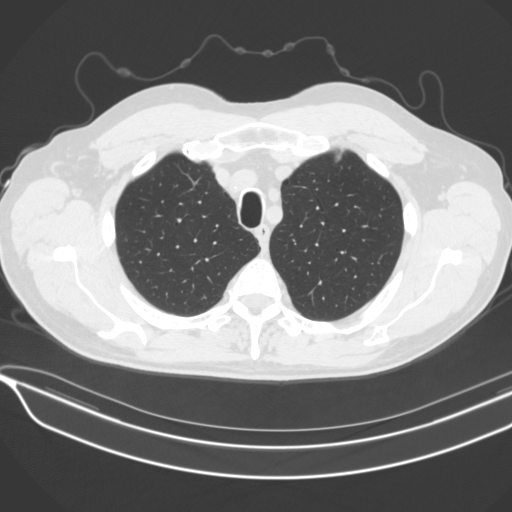
[im 151/170  lung]
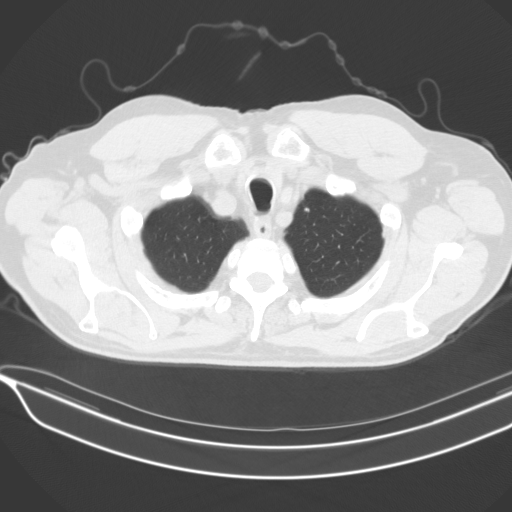
[im 163/170  lung]
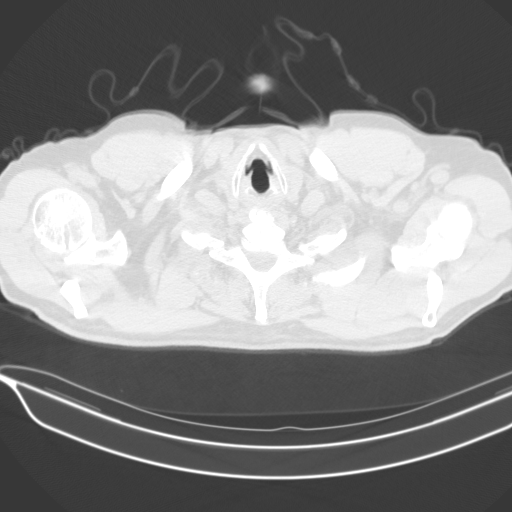

[16 of 34 positions shown; findings below may reference images not displayed]

FINDINGS: Cardiovascular: Heart is normal size. Dense calcifications in the
left anterior descending coronary artery. Scattered aortic
calcifications. No aneurysm.

Mediastinum/Nodes: No mediastinal, hilar, or axillary adenopathy.
Trachea and esophagus are unremarkable. Thyroid unremarkable.

Lungs/Pleura: Linear scarring in both lung bases. No suspicious
nodules. Specifically, no suspicious nodule at the left lung base as
questioned on prior plain films. No confluent opacities or
effusions.

Upper Abdomen: Imaging into the upper abdomen demonstrates no acute
findings.

Musculoskeletal: Chest wall soft tissues are unremarkable. No acute
bony abnormality.
IMPRESSION: No suspicious pulmonary nodules.

Bibasilar scarring.

No active disease.

Coronary artery disease.

Aortic Atherosclerosis ([id number]).

## 2023-03-23 ENCOUNTER — Telehealth: Payer: Self-pay | Admitting: Physician Assistant

## 2023-03-23 NOTE — Telephone Encounter (Signed)
Patient has a question about medication that he is taking I could not understand the name of the medication on the Voicemail he left

## 2023-03-23 NOTE — Telephone Encounter (Signed)
I advised to take as directed 5mg  bid as prescribed. Patient thanked me for calling.

## 2023-03-29 DIAGNOSIS — K080 Exfoliation of teeth due to systemic causes: Secondary | ICD-10-CM | POA: Diagnosis not present

## 2023-04-01 DIAGNOSIS — D225 Melanocytic nevi of trunk: Secondary | ICD-10-CM | POA: Diagnosis not present

## 2023-04-01 DIAGNOSIS — Z08 Encounter for follow-up examination after completed treatment for malignant neoplasm: Secondary | ICD-10-CM | POA: Diagnosis not present

## 2023-04-01 DIAGNOSIS — L814 Other melanin hyperpigmentation: Secondary | ICD-10-CM | POA: Diagnosis not present

## 2023-04-01 DIAGNOSIS — L821 Other seborrheic keratosis: Secondary | ICD-10-CM | POA: Diagnosis not present

## 2023-04-02 DIAGNOSIS — K449 Diaphragmatic hernia without obstruction or gangrene: Secondary | ICD-10-CM | POA: Diagnosis not present

## 2023-04-02 DIAGNOSIS — I2510 Atherosclerotic heart disease of native coronary artery without angina pectoris: Secondary | ICD-10-CM | POA: Diagnosis not present

## 2023-04-02 DIAGNOSIS — Z1331 Encounter for screening for depression: Secondary | ICD-10-CM | POA: Diagnosis not present

## 2023-04-02 DIAGNOSIS — G3184 Mild cognitive impairment, so stated: Secondary | ICD-10-CM | POA: Diagnosis not present

## 2023-04-02 DIAGNOSIS — E7800 Pure hypercholesterolemia, unspecified: Secondary | ICD-10-CM | POA: Diagnosis not present

## 2023-04-02 DIAGNOSIS — Z0000 Encounter for general adult medical examination without abnormal findings: Secondary | ICD-10-CM | POA: Diagnosis not present

## 2023-04-08 ENCOUNTER — Telehealth: Payer: Self-pay | Admitting: Physician Assistant

## 2023-04-08 NOTE — Telephone Encounter (Signed)
Pt wants to see pt sooner than march. He is having dizzy spells

## 2023-04-08 NOTE — Telephone Encounter (Signed)
I advised to hold Memantine he will call next week with a report. He thanked me for calling.

## 2023-04-14 NOTE — Telephone Encounter (Signed)
Patient wants to go ahead and restart Memantine, feeling much better. Please advise Huntley Dec, thanks

## 2023-04-15 NOTE — Telephone Encounter (Signed)
Voicemail at 8:55 04/15/2023 will call back

## 2023-04-15 NOTE — Telephone Encounter (Signed)
I advised to patient and he thanked me for calling, he will restart medication.

## 2023-04-21 DIAGNOSIS — I700 Atherosclerosis of aorta: Secondary | ICD-10-CM | POA: Diagnosis not present

## 2023-04-21 DIAGNOSIS — Z860100 Personal history of colon polyps, unspecified: Secondary | ICD-10-CM | POA: Diagnosis not present

## 2023-04-21 DIAGNOSIS — K222 Esophageal obstruction: Secondary | ICD-10-CM | POA: Diagnosis not present

## 2023-04-27 ENCOUNTER — Telehealth: Payer: Self-pay | Admitting: *Deleted

## 2023-04-27 NOTE — Telephone Encounter (Signed)
   Name: Marcus Hopkins  DOB: 1940-09-11  MRN: [id number]  Primary Cardiologist: Darryle ONEIDA Decent, MD   Preoperative team, please contact this patient and set up a phone call appointment for further preoperative risk assessment. Please obtain consent and complete medication review. Thank you for your help.  I confirm that guidance regarding antiplatelet and oral anticoagulation therapy has been completed and, if necessary, noted below.  His aspirin  and Plavix  may be held for 5-7 days prior to his procedure.  Please resume as soon as hemostasis is achieved.  I also confirmed the patient resides in the state of [place] . As per [facility] Medical Board telemedicine laws, the patient must reside in the state in which the provider is licensed.   [name], NP 04/27/2023, 12:47 PM [facility]

## 2023-04-27 NOTE — Telephone Encounter (Signed)
 Pt has been scheduled tele preop appt 05/11/23. Med rec and consent are done.      Patient Consent for Virtual Visit        Marcus Hopkins has provided verbal consent on 04/27/2023 for a virtual visit (video or telephone).   CONSENT FOR VIRTUAL VISIT FOR:  Marcus Hopkins  By participating in this virtual visit I agree to the following:  I hereby voluntarily request, consent and authorize [facility] and its employed or contracted physicians, physician assistants, nurse practitioners or other licensed health care professionals (the Practitioner), to provide me with telemedicine health care services (the "Services) as deemed necessary by the treating Practitioner. I acknowledge and consent to receive the Services by the Practitioner via telemedicine. I understand that the telemedicine visit will involve communicating with the Practitioner through live audiovisual communication technology and the disclosure of certain medical information by electronic transmission. I acknowledge that I have been given the opportunity to request an in-person assessment or other available alternative prior to the telemedicine visit and am voluntarily participating in the telemedicine visit.  I understand that I have the right to withhold or withdraw my consent to the use of telemedicine in the course of my care at any time, without affecting my right to future care or treatment, and that the Practitioner or I may terminate the telemedicine visit at any time. I understand that I have the right to inspect all information obtained and/or recorded in the course of the telemedicine visit and may receive copies of available information for a reasonable fee.  I understand that some of the potential risks of receiving the Services via telemedicine include:  Delay or interruption in medical evaluation due to technological equipment failure or disruption; Information transmitted may not be sufficient (e.g. poor  resolution of images) to allow for appropriate medical decision making by the Practitioner; and/or  In rare instances, security protocols could fail, causing a breach of personal health information.  Furthermore, I acknowledge that it is my responsibility to provide information about my medical history, conditions and care that is complete and accurate to the best of my ability. I acknowledge that Practitioner's advice, recommendations, and/or decision may be based on factors not within their control, such as incomplete or inaccurate data provided by me or distortions of diagnostic images or specimens that may result from electronic transmissions. I understand that the practice of medicine is not an exact science and that Practitioner makes no warranties or guarantees regarding treatment outcomes. I acknowledge that a copy of this consent can be made available to me via my patient portal [facility] MyChart), or I can request a printed copy by calling the office of [facility].    I understand that my insurance will be billed for this visit.   I have read or had this consent read to me. I understand the contents of this consent, which adequately explains the benefits and risks of the Services being provided via telemedicine.  I have been provided ample opportunity to ask questions regarding this consent and the Services and have had my questions answered to my satisfaction. I give my informed consent for the services to be provided through the use of telemedicine in my medical care

## 2023-04-27 NOTE — Telephone Encounter (Signed)
 Dr. Barbaraann,   Marcus Hopkins 83 year old male is requesting preoperative cardiac evaluation for colonoscopy.  His PMH includes coronary artery disease and hyperlipidemia.  He has had several cardiac catheterizations. PCI 01/22/2020 due to CTO of proximal LAD. 06/24/21 LHC 95% restenosis of LAD treated with scoring balloon angioplasty.  His last LHC was 03/04/2022.  He was noted to have mild ISR of his mid LAD with stent 40-50% and insignificant FFR and otherwise nonobstructive disease.   He was last seen in the clinic on 09/28/2022.  He was doing well at that time.  1 year follow-up was planned.  Requesting office as asked for recommendations on holding clopidogrel  and aspirin .  Please provide recommendations.  Thank you for your help.  Please direct your response to CV DIV preop pool.  [name]. Jackquelyn Sundberg NP-C     04/27/2023, 11:41 AM [facility] [address] Office [phone] Fax [phone]

## 2023-04-27 NOTE — Telephone Encounter (Signed)
   Pre-operative Risk Assessment    Patient Name: Marcus Hopkins  DOB: 03-03-1941 MRN: [id number]   Date of last office visit: 09/28/2022 Date of next office visit: None   Request for Surgical Clearance    Procedure:   Colonoscopy   Date of Surgery:  Clearance 05/24/23                                 Surgeon: Dr. Rosalie Surgeon's Group or Practice Name:  [facility] Phone number:  [phone] Fax number:  [phone]   Type of Clearance Requested:   - Medical  - Pharmacy:  Hold Aspirin  and Clopidogrel  (Plavix ) Not Indicated   Type of Anesthesia:   Propofol   Additional requests/questions:    Signed, Edsel Grayce Sanders   04/27/2023, 9:59 AM

## 2023-04-27 NOTE — Telephone Encounter (Signed)
Pt has been scheduled tele preop appt 05/11/23. Med rec and consent are done.

## 2023-05-07 ENCOUNTER — Encounter: Payer: Self-pay | Admitting: Podiatry

## 2023-05-07 ENCOUNTER — Ambulatory Visit: Payer: Medicare Other | Admitting: Podiatry

## 2023-05-07 DIAGNOSIS — M7751 Other enthesopathy of right foot: Secondary | ICD-10-CM | POA: Diagnosis not present

## 2023-05-07 DIAGNOSIS — M21621 Bunionette of right foot: Secondary | ICD-10-CM | POA: Diagnosis not present

## 2023-05-07 NOTE — Progress Notes (Signed)
 Chief Complaint  Patient presents with   Toe Pain    Patient is having pain that is right under his 5th toe on right foot, it has been 4 to 5 months now the pain is more mild and annoying. Patient does a lot of walking and and it causes discomfort     HPI: 83 y.o. male presents today with right foot pain underneath the fifth toe joint.  Patient reports this has been ongoing for several months.  Does report it has been worsening somewhat.  Worse with weightbearing activity.  Reports increased comfort with certain shoes.  Past Medical History:  Diagnosis Date   Allergic rhinitis    Borderline hyperlipidemia    Cataracts, bilateral    Hemorrhoids    Insomnia     Past Surgical History:  Procedure Laterality Date   CHOLECYSTECTOMY     CORONARY BALLOON ANGIOPLASTY N/A 06/24/2021   Procedure: CORONARY BALLOON ANGIOPLASTY;  Surgeon: Corky Crafts, MD;  Location: [facility] INVASIVE CV LAB;  Service: Cardiovascular;  Laterality: N/A;   CORONARY CTO INTERVENTION N/A 01/22/2020   Procedure: CORONARY CTO INTERVENTION;  Surgeon: Marykay Lex, MD;  Location: [facility] INVASIVE CV LAB;  Service: Cardiovascular;  Laterality: N/A;   CORONARY PRESSURE/FFR STUDY N/A 03/04/2022   Procedure: INTRAVASCULAR PRESSURE WIRE/FFR STUDY;  Surgeon: Marykay Lex, MD;  Location: [facility] INVASIVE CV LAB;  Service: Cardiovascular;  Laterality: N/A;   CORONARY ULTRASOUND/IVUS N/A 06/24/2021   Procedure: Intravascular Ultrasound/IVUS;  Surgeon: Corky Crafts, MD;  Location: [facility] INVASIVE CV LAB;  Service: Cardiovascular;  Laterality: N/A;   ESOPHAGOGASTRODUODENOSCOPY  11/29/2011   Procedure: ESOPHAGOGASTRODUODENOSCOPY (EGD);  Surgeon: Willis Modena, MD;  Location: [facility];  Service: Endoscopy;  Laterality: N/A;   LEFT HEART CATH AND CORONARY ANGIOGRAPHY N/A 01/22/2020   Procedure: LEFT HEART CATH AND CORONARY ANGIOGRAPHY;  Surgeon: Marykay Lex, MD;  Location: [facility] INVASIVE CV LAB;  Service: Cardiovascular;   Laterality: N/A;   LEFT HEART CATH AND CORONARY ANGIOGRAPHY N/A 06/24/2021   Procedure: LEFT HEART CATH AND CORONARY ANGIOGRAPHY;  Surgeon: Corky Crafts, MD;  Location: [facility] INVASIVE CV LAB;  Service: Cardiovascular;  Laterality: N/A;   LEFT HEART CATH AND CORONARY ANGIOGRAPHY N/A 03/04/2022   Procedure: LEFT HEART CATH AND CORONARY ANGIOGRAPHY;  Surgeon: Marykay Lex, MD;  Location: [facility] INVASIVE CV LAB;  Service: Cardiovascular;  Laterality: N/A;   ROTATOR CUFF REPAIR      No Known Allergies  ROS denies any nausea, vomiting, fever, chills, chest pain, shortness of breath   Physical Exam: There were no vitals filed for this visit.  General: The patient is alert and oriented x3 in no acute distress.  Dermatology: Skin is warm, dry and supple bilateral lower extremities. Interspaces are clear of maceration and debris.  Hyperkeratotic lesion present subfifth metatarsal head right foot  Vascular: Palpable pedal pulses bilaterally. Capillary refill within normal limits.  No diffuse appreciable edema.  No erythema or calor.  Neurological: Light touch sensation grossly intact bilateral feet.   Musculoskeletal Exam: Right foot tailor's bunion noted.  Inflammatory capsulitis present about the fifth MTPJ.  Tenderness on palpation of the lateral aspect and plantar aspect of the fifth metatarsal head.  Wide forefoot morphology.    Assessment/Plan of Care: 1. Capsulitis of metatarsophalangeal (MTP) joint of right foot   2. Tailor's bunion of right foot      No orders of the defined types were placed in this encounter.  None  Discussed  clinical findings with patient today.  Hyperkeratotic lesion subfifth metatarsal head debrided as a courtesy today using 15 blade without incident. Salinocaine cream applied  Verbal consent obtained to administer corticosteroid injection to the right fifth metatarsal phalangeal joint periarticular for inflammatory capsulitis, plantar lateral approach.   Alcohol skin prep.  1 cc of dexamethasone mixed with 0.5 cc of half percent Marcaine plain administered.  Band-Aid applied.  Patient tolerated this well.  Reverse dancers pad applied to the right foot to offload the fifth MPJ.  Discussed shoe gear modification, using appropriately wide fitting shoes and use of accommodative padding.  We discussed over-the-counter use of oral Aleve for the next 2 weeks.  Follow-up in 3 to 4 weeks, right foot x-rays if symptoms have not resolved.  Luismario Coston L. Marchia Bond, AACFAS [facility]     [address] [place], [place] [address]                Office [phone]  Fax [phone]

## 2023-05-07 NOTE — Patient Instructions (Signed)
More felt and silicone pads can be purchased from:  [email or web address]   Dancer's pad was dispensed to you today. You may be able to find this on amazon or at some drug stores

## 2023-05-10 DIAGNOSIS — M47816 Spondylosis without myelopathy or radiculopathy, lumbar region: Secondary | ICD-10-CM | POA: Diagnosis not present

## 2023-05-11 ENCOUNTER — Ambulatory Visit: Payer: Medicare Other | Attending: Cardiovascular Disease | Admitting: Student

## 2023-05-11 DIAGNOSIS — Z01810 Encounter for preprocedural cardiovascular examination: Secondary | ICD-10-CM | POA: Diagnosis not present

## 2023-05-11 NOTE — Progress Notes (Signed)
 Virtual Visit via Telephone Note   Because of Marcus Hopkins's co-morbid illnesses, he is at least at moderate risk for complications without adequate follow up.  This format is felt to be most appropriate for this patient at this time.  The patient did not have access to video technology/had technical difficulties with video requiring transitioning to audio format only (telephone).  All issues noted in this document were discussed and addressed.  No physical exam could be performed with this format.  Please refer to the patient's chart for his consent to telehealth for [facility].  Evaluation Performed:  Preoperative cardiovascular risk assessment _____________   Date:  05/11/2023   Patient ID:  Marcus Hopkins, DOB 02-03-1941, MRN [id number] Patient Location:  Home Provider location:   Office  Primary Care Provider:  Thana Ates, MD Primary Cardiologist:  Reatha Harps, MD  Chief Complaint / Patient Profile   83 y.o. y/o male with a h/o CAD s/p PCI with stent to proximal LAD November 2021 and balloon angioplasty to in-stent restenosis of LAD April 2023, ischemic cardiomyopathy, hyperlipidemia who is pending colonoscopy by Dr. Ewing Schlein on 05/24/2023 and presents today for telephonic preoperative cardiovascular risk assessment.  History of Present Illness    Marcus Hopkins is a 83 y.o. male who presents via audio/video conferencing for a telehealth visit today.  Pt was last seen in cardiology clinic on 09/28/2022 by Gillian Shields, NP.  At that time Hjalmer F Weiand was stable from a cardiac standpoint.  The patient is now pending procedure as outlined above. Since his last visit, he is doing well. Patient denies shortness of breath, dyspnea on exertion, lower extremity edema, orthopnea or PND. No chest pain, pressure, or tightness. No palpitations.  He is very active walking daily as the weather permits. He also does light weight training at least 5 days a week.   Past  Medical History    Past Medical History:  Diagnosis Date   Allergic rhinitis    Borderline hyperlipidemia    Cataracts, bilateral    Hemorrhoids    Insomnia    Past Surgical History:  Procedure Laterality Date   CHOLECYSTECTOMY     CORONARY BALLOON ANGIOPLASTY N/A 06/24/2021   Procedure: CORONARY BALLOON ANGIOPLASTY;  Surgeon: Corky Crafts, MD;  Location: [facility] INVASIVE CV LAB;  Service: Cardiovascular;  Laterality: N/A;   CORONARY CTO INTERVENTION N/A 01/22/2020   Procedure: CORONARY CTO INTERVENTION;  Surgeon: Marykay Lex, MD;  Location: [facility] INVASIVE CV LAB;  Service: Cardiovascular;  Laterality: N/A;   CORONARY PRESSURE/FFR STUDY N/A 03/04/2022   Procedure: INTRAVASCULAR PRESSURE WIRE/FFR STUDY;  Surgeon: Marykay Lex, MD;  Location: [facility] INVASIVE CV LAB;  Service: Cardiovascular;  Laterality: N/A;   CORONARY ULTRASOUND/IVUS N/A 06/24/2021   Procedure: Intravascular Ultrasound/IVUS;  Surgeon: Corky Crafts, MD;  Location: [facility] INVASIVE CV LAB;  Service: Cardiovascular;  Laterality: N/A;   ESOPHAGOGASTRODUODENOSCOPY  11/29/2011   Procedure: ESOPHAGOGASTRODUODENOSCOPY (EGD);  Surgeon: Willis Modena, MD;  Location: [facility];  Service: Endoscopy;  Laterality: N/A;   LEFT HEART CATH AND CORONARY ANGIOGRAPHY N/A 01/22/2020   Procedure: LEFT HEART CATH AND CORONARY ANGIOGRAPHY;  Surgeon: Marykay Lex, MD;  Location: [facility] INVASIVE CV LAB;  Service: Cardiovascular;  Laterality: N/A;   LEFT HEART CATH AND CORONARY ANGIOGRAPHY N/A 06/24/2021   Procedure: LEFT HEART CATH AND CORONARY ANGIOGRAPHY;  Surgeon: Corky Crafts, MD;  Location: [facility] INVASIVE CV LAB;  Service: Cardiovascular;  Laterality: N/A;  LEFT HEART CATH AND CORONARY ANGIOGRAPHY N/A 03/04/2022   Procedure: LEFT HEART CATH AND CORONARY ANGIOGRAPHY;  Surgeon: Marykay Lex, MD;  Location: [facility] INVASIVE CV LAB;  Service: Cardiovascular;  Laterality: N/A;   ROTATOR CUFF REPAIR      Allergies  No Known  Allergies  Home Medications    Prior to Admission medications   Medication Sig Start Date End Date Taking? Authorizing Provider  acetaminophen (TYLENOL) 500 MG tablet Take 500-1,000 mg by mouth every 6 (six) hours as needed for moderate pain or headache.    [provider]  amLODipine (NORVASC) 2.5 MG tablet Take 1 tablet (2.5 mg total) by mouth at bedtime. 07/15/22   Alver Sorrow, NP  aspirin EC 81 MG tablet Take 1 tablet (81 mg total) by mouth daily. Swallow whole. 01/17/20   O'NealRonnald Ramp, MD  Cholecalciferol (VITAMIN D-3) 25 MCG (1000 UT) CAPS Take 1,000 Units by mouth daily.    [provider]  clopidogrel (PLAVIX) 75 MG tablet Take 1 tablet (75 mg total) by mouth daily. 09/28/22   Alver Sorrow, NP  diphenhydrAMINE (BENADRYL) 25 MG tablet Take 25 mg by mouth daily as needed for allergies.    [provider]  memantine (NAMENDA) 5 MG tablet Take 1 tablet (5 mg at night) for 2 weeks, then increase to 1 tablet (5 mg) twice a day 03/04/23   Marcos Eke, PA-C  metoprolol succinate (TOPROL-XL) 25 MG 24 hr tablet TAKE ONE-HALF (0.5) TABLET BY MOUTH DAILY 06/05/22   Alver Sorrow, NP  nitroGLYCERIN (NITROSTAT) 0.4 MG SL tablet Place 1 tablet (0.4 mg total) under the tongue every 5 (five) minutes as needed for chest pain. 09/28/22   Alver Sorrow, NP  pantoprazole (PROTONIX) 40 MG tablet Take 40 mg by mouth daily as needed (acid reflux). 11/12/21   [provider]  rosuvastatin (CRESTOR) 40 MG tablet TAKE 1 TABLET BY MOUTH DAILY 12/28/22   Alver Sorrow, NP    Physical Exam    Vital Signs:  Ronald F Sannes does not have vital signs available for review today.  Given telephonic nature of communication, physical exam is limited. AAOx3. NAD. Normal affect.  Speech and respirations are unlabored.  Assessment & Plan    Primary Cardiologist: Reatha Harps, MD  Preoperative cardiovascular risk assessment.  Colonoscopy by Dr. Ewing Schlein on  05/24/2023.  Chart reviewed as part of pre-operative protocol coverage. According to the RCRI, patient has a 0.9% risk of MACE. Patient reports activity equivalent to 4.0 METS (walks daily weather permitting, performs light weight training at least 5 days a week).   Given past medical history and time since last visit, based on ACC/AHA guidelines, Zeth F Hymon would be at acceptable risk for the planned procedure without further cardiovascular testing.   Patient was advised that if he develops new symptoms prior to surgery to contact our office to arrange a follow-up appointment.  he verbalized understanding.  Per Dr. Flora Lipps, he may hold Plavix for 5 days prior to procedure and should resume as soon as hemodynamically stable postoperatively.  Ideally aspirin should be continued without interruption, however if the bleeding risk is too great, aspirin may be held for 5-7 days prior to surgery. Please resume aspirin post operatively when it is felt to be safe from a bleeding standpoint.    I will route this recommendation to the requesting party via Epic fax function.  Please call with questions.  Time:   Today,  I have spent 5 minutes with the patient with telehealth technology discussing medical history, symptoms, and management plan.     Carlos Levering, NP  05/11/2023, 7:26 AM

## 2023-05-24 DIAGNOSIS — Z860100 Personal history of colon polyps, unspecified: Secondary | ICD-10-CM | POA: Diagnosis not present

## 2023-05-24 DIAGNOSIS — K5730 Diverticulosis of large intestine without perforation or abscess without bleeding: Secondary | ICD-10-CM | POA: Diagnosis not present

## 2023-05-24 DIAGNOSIS — Z09 Encounter for follow-up examination after completed treatment for conditions other than malignant neoplasm: Secondary | ICD-10-CM | POA: Diagnosis not present

## 2023-05-30 ENCOUNTER — Other Ambulatory Visit (HOSPITAL_BASED_OUTPATIENT_CLINIC_OR_DEPARTMENT_OTHER): Payer: Self-pay | Admitting: Family

## 2023-06-03 ENCOUNTER — Ambulatory Visit: Payer: Medicare Other | Admitting: Physician Assistant

## 2023-06-10 DIAGNOSIS — M47816 Spondylosis without myelopathy or radiculopathy, lumbar region: Secondary | ICD-10-CM | POA: Diagnosis not present

## 2023-07-11 ENCOUNTER — Other Ambulatory Visit (HOSPITAL_BASED_OUTPATIENT_CLINIC_OR_DEPARTMENT_OTHER): Payer: Self-pay | Admitting: Family

## 2023-07-11 DIAGNOSIS — I25118 Atherosclerotic heart disease of native coronary artery with other forms of angina pectoris: Secondary | ICD-10-CM

## 2023-07-19 DIAGNOSIS — M47816 Spondylosis without myelopathy or radiculopathy, lumbar region: Secondary | ICD-10-CM | POA: Diagnosis not present

## 2023-09-28 DIAGNOSIS — K080 Exfoliation of teeth due to systemic causes: Secondary | ICD-10-CM | POA: Diagnosis not present

## 2023-10-07 ENCOUNTER — Other Ambulatory Visit (HOSPITAL_BASED_OUTPATIENT_CLINIC_OR_DEPARTMENT_OTHER): Payer: Self-pay | Admitting: Family

## 2023-10-07 DIAGNOSIS — I25118 Atherosclerotic heart disease of native coronary artery with other forms of angina pectoris: Secondary | ICD-10-CM

## 2023-10-14 DIAGNOSIS — Z08 Encounter for follow-up examination after completed treatment for malignant neoplasm: Secondary | ICD-10-CM | POA: Diagnosis not present

## 2023-10-14 DIAGNOSIS — D225 Melanocytic nevi of trunk: Secondary | ICD-10-CM | POA: Diagnosis not present

## 2023-10-14 DIAGNOSIS — L821 Other seborrheic keratosis: Secondary | ICD-10-CM | POA: Diagnosis not present

## 2023-10-14 DIAGNOSIS — L814 Other melanin hyperpigmentation: Secondary | ICD-10-CM | POA: Diagnosis not present

## 2023-11-12 ENCOUNTER — Ambulatory Visit (HOSPITAL_BASED_OUTPATIENT_CLINIC_OR_DEPARTMENT_OTHER): Admitting: Family

## 2023-11-12 ENCOUNTER — Encounter (HOSPITAL_BASED_OUTPATIENT_CLINIC_OR_DEPARTMENT_OTHER): Payer: Self-pay | Admitting: Family

## 2023-11-12 VITALS — BP 120/70 | HR 65 | Resp 16 | Ht 69.0 in | Wt 165.0 lb

## 2023-11-12 DIAGNOSIS — I25118 Atherosclerotic heart disease of native coronary artery with other forms of angina pectoris: Secondary | ICD-10-CM | POA: Diagnosis not present

## 2023-11-12 DIAGNOSIS — E785 Hyperlipidemia, unspecified: Secondary | ICD-10-CM

## 2023-11-12 DIAGNOSIS — R5383 Other fatigue: Secondary | ICD-10-CM

## 2023-11-12 DIAGNOSIS — E559 Vitamin D deficiency, unspecified: Secondary | ICD-10-CM

## 2023-11-12 NOTE — Progress Notes (Signed)
 Cardiology Office Note:  .   Date:  11/12/2023  ID:  JAIMIN KRUPKA, DOB November 02, 1940, MRN [id number] PCP: Dwight Trula SQUIBB, MD  [facility] Providers Cardiologist:  Darryle ONEIDA Decent, MD    History of Present Illness: .   Marcus Hopkins is a 83 y.o. male with a hx of CAD s/p PCI (01/2020 PCI to proximal LAD in setting of CTO, 06/2021 scoring balloon angioplasty to ISR of LAD), hyperlipidemia last seen 03/27/22.    01/2020 PCI to prox LAD in setting of CTO. At follow up  12/17/2020  had completed 45-month course of DAPT and LDL was 58 which is at goal of less than 70.  He was doing well from a cardiac perspective and recommended to follow-up in a year. However seen 06/14/21 noted right sided chest discomfort with ambulation that was different than anginal equivalent. Metoprolol  succinate 12.5mg  QD was added due to known distal LAD disease. At follow up 06/23/21 noted persistent chest pain that was relieved with nitroglycerin  but was still right sided. As such, was set up for LHC. Underwent cardiac catheterization 06/21/21 revealing 95% restenosis of mid LAD treated with scoring balloon angioplasty.Recommended for DAPT Brilinta  and aspirin  for 1 month then transition to Plavix . Ultimately long term plan for Plavix  monotherapy.    At follow up 07/24/21 doing well and Brilinta  was transitioned to Plavix . Seen 11/09/2021 for chest discomfort described as an intermittent twinge and was started on amlodipine  2.5 mg nightly. Seen 02/27/22 with one week history of chest pain. LHC 03/04/2022 with mild ISR of mid LAD stent 40-50% which was insignificant by FFR with otherwise widely patent distal pression of stent and normal coronaries throughout.  Seen 03/27/22 and 09/2022 doing well from cardiac perspective with no changes made.   He presents today for follow up independently.  He is walking 2 or more miles per day for exercise without difficulty at [facility] or [employer]. Reports no shortness of breath nor  dyspnea on exertion. Reports no chest pain, pressure, or tightness. No edema, orthopnea, PND. Reports no palpitations.  Does note some generalized fatigue. Using bathroom twice per night and has difficulty falling back asleep. Reports intermittent snoring, but politely declines sleep study. He is not sure if symptoms are just getting older or something clinical but his wife asked him to report.   ROS: Please see the history of present illness.    All other systems reviewed and are negative.   Studies Reviewed: SABRA   EKG Interpretation Date/Time:  Friday November 12 2023 10:41:16 EDT Ventricular Rate:  65 PR Interval:  238 QRS Duration:  86 QT Interval:  386 QTC Calculation: 401 R Axis:   -28  Text Interpretation: Sinus rhythm with 1st degree A-V block No acute ST/T wave changes Confirmed by Vannie Mora ([id number]) on 11/12/2023 10:44:57 AM    Cardiac Studies & Procedures   ______________________________________________________________________________________________ CARDIAC CATHETERIZATION  CARDIAC CATHETERIZATION 03/04/2022  Conclusion   Mid LAD stent segment: Focal mid LAD lesion is 45% stenosed. - >  In-stent restenosis of the overlapping segment of the originally placed stents.  Nonsignificant by RFR and FFR.  Otherwise widely patent stent segment in the LAD.   Dist LAD lesion is 15% stenosed. 3rd Sept lesion is 25% stenosed. ->  Notably improved distal flow.   Prox RCA-1 lesion is 25% stenosed.  Prox RCA-2 lesion is 25% stenosed.   The left ventricular systolic function is normal.   LV end diastolic pressure is normal.   --------------------------------------------------------------  POST-CATH DIAGNOSES Mild in-stent restenosis of the mid LAD stent overlap segment roughly 40 to 50%. SABRA RFR was 0.92, FFR 0.89-not significant. The distal LAD is actually significant improved from initial PCI. No longer has lesion. Otherwise widely patent likely distal portion of the stent and normal  coronary arteries throughout. Normal LVEF and EDP.    RECOMMENDATIONS Okay for discharge home today.  Follow-up with Dr. Barbaraann. Would there is a possibility of titrating up amlodipine  for potential spasm or other medical management.  However with no flow-limiting lesion, would consider that the patient symptoms may not be cardiac in nature.   Alm Clay, MD  Findings Coronary Findings Diagnostic  Dominance: Right  Left Main Vessel is large.  Left Anterior Descending Collaterals Dist LAD filled by collaterals from RPDA.  Mid LAD lesion is 45% stenosed. The lesion is focal, concentric and smooth. The lesion is calcified. Focal ISR The lesion was previously treated using a drug eluting stent and angioplasty over 2 years ago. Previously placed stent displays restenosis. Pressure wire/FFR was performed on the lesion. FFR: 0.89. RFR 0.92 Dist LAD lesion is 15% stenosed. The lesion is segmental and eccentric. Actually this has the appearance of slow flow due to retrograde flow filling.  First Septal Branch  Second Diagonal Branch Vessel is small in size.  Second Septal Branch Vessel is small in size.  Third Diagonal Branch  Third Septal Branch Actually fourth diagonal 3rd Sept lesion is 25% stenosed. The lesion is eccentric and irregular.  Right Coronary Artery Vessel is large. Prox RCA-1 lesion is 25% stenosed. The lesion is focal and eccentric. Prox RCA-2 lesion is 25% stenosed. The lesion is located at the bend, focal and concentric.  Right Posterior Descending Artery Vessel is large in size.  Right Posterior Atrioventricular Artery Vessel is small in size.  Intervention  No interventions have been documented.   CARDIAC CATHETERIZATION  CARDIAC CATHETERIZATION 06/24/2021  Conclusion   Dist LAD lesion is 75% stenosed.   Prox RCA-1 lesion is 25% stenosed.   Prox RCA-2 lesion is 25% stenosed.   3rd Sept lesion is 50% stenosed.   Mid LAD lesion is 95%  stenosed.  This was in-stent restenosis of a 3.0 x 12 and 2.5 mm diameter stent placed in 2021, postdilated to 3.25 mm.  The in-stent restenosis began at the stent overlap and extended into the more distal stent from 2021.   Scoring balloon angioplasty was performed using a BALLN SCOREFLEX 3.0X15.  The distal stent was postdilated to 3.5 mm [place] balloon and the proximal stent was postdilated to 4 mm [place] balloon.  Intravascular ultrasound was used to optimize the angioplasty.   Post intervention, there is a 0% residual stenosis.   The left ventricular systolic function is normal.   LV end diastolic pressure is normal.   The left ventricular ejection fraction is 55-65% by visual estimate.   There is no aortic valve stenosis.  Successful scoring balloon angioplasty of the in-stent restenosis of the previously placed LAD stents.  Brilinta  started during the procedure.  Would continue Brilinta  for 30 days.  Okay to switch back to clopidogrel .  Given the length of stented segment, would recommend clopidogrel  monotherapy going forward.  Findings Coronary Findings Diagnostic  Dominance: Right  Left Main Vessel is large.  Left Anterior Descending Collaterals Dist LAD filled by collaterals from RPDA.  Mid LAD lesion is 95% stenosed. Vessel is the culprit lesion. The lesion is segmental and chronically occluded. The lesion was previously treated using  a drug eluting stent between 1-2 years ago. Previously placed stent displays restenosis. Ultrasound (IVUS) was performed. Severe plaque burden was detected. Dist LAD lesion is 75% stenosed. The lesion is segmental and eccentric. Actually this has the appearance of slow flow due to retrograde flow filling.  First Septal Branch Collaterals 1st Sept filled by collaterals from RPDA.  Second Diagonal Branch Vessel is small in size.  Second Septal Branch Vessel is small in size. Collaterals 2nd Sept filled by collaterals from RPDA.  Third Diagonal  Branch Collaterals 3rd Diag filled by collaterals from 1st Diag.  Third Septal Branch Actually fourth diagonal 3rd Sept lesion is 50% stenosed. The lesion is eccentric and irregular.  Right Coronary Artery Vessel is large. Prox RCA-1 lesion is 25% stenosed. The lesion is focal and eccentric. Prox RCA-2 lesion is 25% stenosed. The lesion is located at the bend, focal and concentric.  Right Posterior Descending Artery Vessel is large in size.  Right Posterior Atrioventricular Artery Vessel is small in size.  Intervention  Mid LAD lesion Angioplasty CATH LAUNCHER 6FR EBU 3.75 guide catheter was inserted. WIRE ASAHI PROWATER 180CM guidewire used to cross lesion. Scoring balloon angioplasty was performed using a BALLN SCOREFLEX 3.0X15. Maximum pressure: 18 atm. Severe in-stent restenosis.  Scoring balloon angioplasty was performed.  Intravascular ultrasound was performed showing that the distal stent could be postdilated to 3.5 mm in the proximal stent to be postdilated to 4 mm.  The stented area was postdilated with a 3.5 millimeter.  When we tried to advance the 4 mm  balloon, guide position was lost.  We then switched to an EBU 3.75 guide catheter for additional support.  The 4 mm balloon was successfully advanced and postdilated the proximal stent, which was a 3.0 x 12 placed in 2021.  No new stents were placed during this procedure.  Follow-up intravascular ultrasound showed no evidence of edge dissection and improved expansion of the previously placed stents. Post-Intervention Lesion Assessment The intervention was successful. Pre-interventional TIMI flow is 1. Post-intervention TIMI flow is 3. No complications occurred at this lesion. Ultrasound (IVUS) was performed on the lesion post PCI using a CATH OPTICROSS HD. There is a 0% residual stenosis post intervention.   STRESS TESTS  MYOCARDIAL PERFUSION IMAGING 01/17/2020  Interpretation Summary  The left ventricular ejection  fraction is moderately decreased (30-44%).  Nuclear stress EF: 42%.  There was no ST segment deviation noted during stress.  Defect 1: There is a large defect of moderate severity present in the mid anterior, mid inferoseptal, apical anterior, apical septal and apex location.  Defect 2: There is a defect present in the apical lateral location.  Findings consistent with ischemia in the LAD territory.  This is a high risk study.   ECHOCARDIOGRAM  ECHOCARDIOGRAM COMPLETE 02/02/2020  Narrative ECHOCARDIOGRAM REPORT    Patient Name:   Marcus Hopkins Date of Exam: 02/02/2020 Medical Rec #:  [id number]        Height:       69.0 in Accession #:    [id number]       Weight:       161.8 lb Date of Birth:  Nov 14, 1940       BSA:          1.888 m Patient Age:    78 years         BP:           106/68 mmHg Patient Gender: M  HR:           52 bpm. Exam Location:  [address]  Procedure: 2D Echo, Cardiac Doppler and Color Doppler  Indications:    R07.9* Chest pain, unspecified  History:        Patient has no prior history of Echocardiogram examinations. Abnormal stress test, Signs/Symptoms:Chest Pain; Risk Factors:Former Smoker.  Sonographer:    Elsie Bohr RDCS Referring Phys: [id number] DARRYLE NED O'NEAL  IMPRESSIONS   1. LVEF is approximately 50% with hypokinesis of the distal anterior, distal lateral and apical walls. 2. Right ventricular systolic function is normal. The right ventricular size is normal. 3. Trivial mitral valve regurgitation.  FINDINGS Left Ventricle: Hypokinesis in the distal anterior, distal lateral and apical walls. Left ventricular ejection fraction, by estimation, is 50%%. The left ventricle has mildly decreased function. The left ventricle demonstrates regional wall motion abnormalities. The left ventricular internal cavity size was normal in size. There is no left ventricular hypertrophy. Left ventricular diastolic parameters are  indeterminate.  Right Ventricle: The right ventricular size is normal. Right vetricular wall thickness was not assessed. Right ventricular systolic function is normal.  Left Atrium: Left atrial size was normal in size.  Right Atrium: Right atrial size was normal in size.  Pericardium: There is no evidence of pericardial effusion.  Mitral Valve: The mitral valve is grossly normal. Trivial mitral valve regurgitation.  Tricuspid Valve: The tricuspid valve is normal in structure. Tricuspid valve regurgitation is mild.  Aortic Valve: The aortic valve is tricuspid. Aortic valve regurgitation is trivial.  Pulmonic Valve: The pulmonic valve was grossly normal. Pulmonic valve regurgitation is not visualized.  Aorta: The aortic root is normal in size and structure.  IAS/Shunts: No atrial level shunt detected by color flow Doppler.   LEFT VENTRICLE PLAX 2D LVIDd:         5.00 cm  Diastology LVIDs:         3.50 cm  LV e' medial:    6.64 cm/s LV PW:         0.70 cm  LV E/e' medial:  9.0 LV IVS:        0.90 cm  LV e' lateral:   9.03 cm/s LVOT diam:     2.40 cm  LV E/e' lateral: 6.6 LV SV:         89 LV SV Index:   47 LVOT Area:     4.52 cm   RIGHT VENTRICLE             IVC RV S prime:     13.70 cm/s  IVC diam: 1.00 cm TAPSE (M-mode): 2.9 cm RVSP:           24.5 mmHg  LEFT ATRIUM             Index       RIGHT ATRIUM           Index LA diam:        3.60 cm 1.91 cm/m  RA Pressure: 3.00 mmHg LA Vol (A2C):   39.1 ml 20.71 ml/m RA Area:     16.80 cm LA Vol (A4C):   29.1 ml 15.41 ml/m RA Volume:   48.80 ml  25.85 ml/m LA Biplane Vol: 33.4 ml 17.69 ml/m AORTIC VALVE LVOT Vmax:   105.00 cm/s LVOT Vmean:  59.400 cm/s LVOT VTI:    0.196 m  AORTA Ao Root diam: 3.60 cm Ao Asc diam:  3.40 cm  MV E velocity: 59.50 cm/s  TRICUSPID  VALVE MV A velocity: 90.00 cm/s  TR Peak grad:   21.5 mmHg MV E/A ratio:  0.66        TR Vmax:        232.00 cm/s Estimated RAP:  3.00 mmHg RVSP:            24.5 mmHg  SHUNTS Systemic VTI:  0.20 m Systemic Diam: 2.40 cm  Vina Gull MD Electronically signed by Vina Gull MD Signature Date/Time: 02/02/2020/6:22:37 PM    Final          ______________________________________________________________________________________________      Risk Assessment/Calculations:             Physical Exam:   VS:  BP 120/70 (BP Location: Left Arm, Patient Position: Sitting, Cuff Size: Normal)   Pulse 65   Resp 16   Ht 5' 9 (1.753 m)   Wt 165 lb (74.8 kg)   SpO2 95%   BMI 24.37 kg/m    Wt Readings from Last 3 Encounters:  11/12/23 165 lb (74.8 kg)  09/28/22 160 lb (72.6 kg)  03/27/22 164 lb (74.4 kg)    GEN: Well nourished, well developed in no acute distress NECK: No JVD; No carotid bruits CARDIAC: RRR, no murmurs, rubs, gallops RESPIRATORY:  Clear to auscultation without rales, wheezing or rhonchi  ABDOMEN: Soft, non-tender, non-distended EXTREMITIES:  No edema; No deformity   ASSESSMENT AND PLAN: .    CAD  -PCI 01/22/2020 due to CTO of proximal LAD. 06/24/21 LHC 95% restenosis of LAD treated with scoring balloon angioplasty. LHC 03/04/22 moderate ISR of LAD 40-50% negative by FFR with no obstructive disease. No recurrent chest pain  GDMT includes aspirin  81mg  daily, Plavix  75mg  daily, Metoprolol  12.5mg  daily, Rosuvastatin  20mg  daily, Amlodipine  2.5mg  daily. Given length of stented segment will continue DAPT but could be consolidated to Plavix  monotherapy if needed in the future.  03/30/22 ALT 27, AST 26, total cholesterol 141, HDL 72, LDL 55, triglycerides 67   HLD, LDL goal less than 70 - 03/2023 LDL 48 Continue rosuvastatin  20 mg daily.  Denies myalgias.   Fatigue - reports only intermittent snoring, politely declines sleep study. Notes difficulty staying asleep. Will update CBC, TSH, B12, Vitamin D to rule out anemia, thyroid  abnormality, vitamin deficiency as contributory. If lab workup unremarkable, recommend follow up with PCP.         Dispo: follow up in one year with Dr. Barbaraann or Reche GORMAN Finder, NP   Signed, Reche GORMAN Finder, NP

## 2023-11-12 NOTE — Patient Instructions (Addendum)
 Medication Instructions:   Your physician recommends that you continue on your current medications as directed. Please refer to the Current Medication list given to you today.   *If you need a refill on your cardiac medications before your next appointment, please call your pharmacy*  Lab Work:  None ordered.  If you have labs (blood work) drawn today and your tests are completely normal, you will receive your results only by: MyChart Message (if you have MyChart) OR A paper copy in the mail If you have any lab test that is abnormal or we need to change your treatment, we will call you to review the results.  Testing/Procedures:  None ordered.  Follow-Up: At [facility], you and your health needs are our priority.  As part of our continuing mission to provide you with exceptional heart care, our providers are all part of one team.  This team includes your primary Cardiologist (physician) and Advanced Practice Providers or APPs (Physician Assistants and Nurse Practitioners) who all work together to provide you with the care you need, when you need it.  Your next appointment:   1 year(s)  Provider:   Darryle Decent, MD or Reche Finder, NP    We recommend signing up for the patient portal called MyChart.  Sign up information is provided on this After Visit Summary.  MyChart is used to connect with patients for Virtual Visits (Telemedicine).  Patients are able to view lab/test results, encounter notes, upcoming appointments, etc.  Non-urgent messages can be sent to your provider as well.   To learn more about what you can do with MyChart, go to [email or web address].   Other Instructions  Your physician wants you to follow-up in: 1 year.  You will receive a reminder letter in the mail two months in advance. If you don't receive a letter, please call our office to schedule the follow-up appointment.      We are checking your thyroid , hemoglobin, vitamin levels to make  sure they are not causing your fatigue. If they are normal and your fatigue persists, would recommend discussing with Dr. Dwight. Tips on sleep included for you below:

## 2023-11-13 LAB — CBC
Hematocrit: 47.6 % (ref 37.5–51.0)
Hemoglobin: 15.5 g/dL (ref 13.0–17.7)
MCH: 31.7 pg (ref 26.6–33.0)
MCHC: 32.6 g/dL (ref 31.5–35.7)
MCV: 97 fL (ref 79–97)
Platelets: 264 x10E3/uL (ref 150–450)
RBC: 4.89 x10E6/uL (ref 4.14–5.80)
RDW: 12.9 % (ref 11.6–15.4)
WBC: 4.5 x10E3/uL (ref 3.4–10.8)

## 2023-11-13 LAB — VITAMIN D 25 HYDROXY (VIT D DEFICIENCY, FRACTURES): Vit D, 25-Hydroxy: 54.7 ng/mL (ref 30.0–100.0)

## 2023-11-13 LAB — TSH: TSH: 2.48 u[IU]/mL (ref 0.450–4.500)

## 2023-11-13 LAB — VITAMIN B12: Vitamin B-12: 477 pg/mL (ref 232–1245)

## 2023-11-15 ENCOUNTER — Ambulatory Visit (HOSPITAL_BASED_OUTPATIENT_CLINIC_OR_DEPARTMENT_OTHER): Payer: Self-pay | Admitting: Family

## 2023-11-24 DIAGNOSIS — G4700 Insomnia, unspecified: Secondary | ICD-10-CM | POA: Diagnosis not present

## 2024-01-10 ENCOUNTER — Other Ambulatory Visit (HOSPITAL_BASED_OUTPATIENT_CLINIC_OR_DEPARTMENT_OTHER): Payer: Self-pay | Admitting: Family

## 2024-01-10 DIAGNOSIS — I25118 Atherosclerotic heart disease of native coronary artery with other forms of angina pectoris: Secondary | ICD-10-CM

## 2024-02-04 ENCOUNTER — Ambulatory Visit: Payer: Self-pay

## 2024-02-04 ENCOUNTER — Institutional Professional Consult (permissible substitution): Payer: Medicare Other | Admitting: Psychology

## 2024-02-11 ENCOUNTER — Encounter: Payer: Medicare Other | Admitting: Psychology

## 2024-03-01 ENCOUNTER — Other Ambulatory Visit: Payer: Self-pay

## 2024-03-01 ENCOUNTER — Emergency Department (HOSPITAL_COMMUNITY)
Admission: EM | Admit: 2024-03-01 | Discharge: 2024-03-01 | Discharge status: Against Medical Advice | Attending: Emergency Medicine | Admitting: Emergency Medicine

## 2024-03-01 DIAGNOSIS — I959 Hypotension, unspecified: Secondary | ICD-10-CM | POA: Diagnosis not present

## 2024-03-01 DIAGNOSIS — Z7902 Long term (current) use of antithrombotics/antiplatelets: Secondary | ICD-10-CM | POA: Diagnosis not present

## 2024-03-01 DIAGNOSIS — Z91040 Latex allergy status: Secondary | ICD-10-CM | POA: Diagnosis not present

## 2024-03-01 DIAGNOSIS — R55 Syncope and collapse: Secondary | ICD-10-CM | POA: Diagnosis present

## 2024-03-01 DIAGNOSIS — Z7982 Long term (current) use of aspirin: Secondary | ICD-10-CM | POA: Diagnosis not present

## 2024-03-01 DIAGNOSIS — Z5321 Procedure and treatment not carried out due to patient leaving prior to being seen by health care provider: Secondary | ICD-10-CM | POA: Diagnosis not present

## 2024-03-01 LAB — COMPREHENSIVE METABOLIC PANEL WITH GFR
ALT: 36 U/L (ref 0–44)
AST: 29 U/L (ref 15–41)
Albumin: 3.7 g/dL (ref 3.5–5.0)
Alkaline Phosphatase: 64 U/L (ref 38–126)
Anion gap: 13 (ref 5–15)
BUN: 15 mg/dL (ref 8–23)
CO2: 20 mmol/L — ABNORMAL LOW (ref 22–32)
Calcium: 8.3 mg/dL — ABNORMAL LOW (ref 8.9–10.3)
Chloride: 106 mmol/L (ref 98–111)
Creatinine, Ser: 0.97 mg/dL (ref 0.61–1.24)
GFR, Estimated: >60 mL/min (ref >60)
Glucose, Bld: 100 mg/dL — ABNORMAL HIGH (ref 70–99)
Potassium: 3.4 mmol/L — ABNORMAL LOW (ref 3.5–5.1)
Sodium: 139 mmol/L (ref 135–145)
Total Bilirubin: 0.3 mg/dL (ref 0.0–1.2)
Total Protein: 6.1 g/dL — ABNORMAL LOW (ref 6.5–8.1)

## 2024-03-01 LAB — CBC
HCT: 38.4 % — ABNORMAL LOW (ref 39.0–52.0)
Hemoglobin: 13.1 g/dL (ref 13.0–17.0)
MCH: 32.5 pg (ref 26.0–34.0)
MCHC: 34.1 g/dL (ref 30.0–36.0)
MCV: 95.3 fL (ref 80.0–100.0)
Platelets: 182 K/uL (ref 150–400)
RBC: 4.03 MIL/uL — ABNORMAL LOW (ref 4.22–5.81)
RDW: 12.8 % (ref 11.5–15.5)
WBC: 7.3 K/uL (ref 4.0–10.5)
nRBC: 0 % (ref 0.0–0.2)

## 2024-03-01 LAB — CBG MONITORING, ED: Glucose-Capillary: 124 mg/dL — ABNORMAL HIGH (ref 70–99)

## 2024-03-01 NOTE — Discharge Instructions (Addendum)
 You are elected to leave AGAINST MEDICAL ADVICE. You have accepted the risks of leaving, including worsening of your condition, up to and including death.  You have agreed to these risks.  Please return to the ER at your earliest convenience.

## 2024-03-01 NOTE — ED Provider Notes (Signed)
 [place] EMERGENCY DEPARTMENT AT [facility] Provider Note   CSN: [id number] Arrival date & time: 03/01/24  1918     History  No chief complaint on file.   Marcus Hopkins is a 83 y.o. male with PMH as listed below who presents with his wife who reports syncopal episode. Patient apparently was hypotensive on EMS arrival who gave 750 cc of IVF and patient's BP improved into 80/50. Patient has h/o bradycardia and 1st degree heart block. He has BP in 90s/60s on arrival to ED and upon my arrival into the room states he wants to be discharged AMA. He states he can drink fluids at home.    Past Medical History:  Diagnosis Date   Allergic rhinitis    Borderline hyperlipidemia    Cataracts, bilateral    Hemorrhoids    Insomnia        Home Medications Prior to Admission medications   Medication Sig Start Date End Date Taking? Authorizing Provider  acetaminophen  (TYLENOL ) 500 MG tablet Take 500-1,000 mg by mouth every 6 (six) hours as needed for moderate pain or headache.    [provider]  amLODipine  (NORVASC ) 2.5 MG tablet TAKE 1 TABLET BY MOUTH AT BEDTIME 01/13/24   Walker, Caitlin S, NP  aspirin  EC 81 MG tablet Take 1 tablet (81 mg total) by mouth daily. Swallow whole. 01/17/20   O'NealDarryle Ned, MD  Cholecalciferol  (VITAMIN D -3) 25 MCG (1000 UT) CAPS Take 1,000 Units by mouth daily.    [provider]  clopidogrel  (PLAVIX ) 75 MG tablet TAKE 1 TABLET BY MOUTH DAILY 01/13/24   Walker, Caitlin S, NP  diphenhydrAMINE  (BENADRYL ) 25 MG tablet Take 25 mg by mouth daily as needed for allergies.    [provider]  meloxicam (MOBIC) 7.5 MG tablet Take 7.5 mg by mouth daily. 05/19/23   [provider]  memantine  (NAMENDA ) 5 MG tablet Take 1 tablet (5 mg at night) for 2 weeks, then increase to 1 tablet (5 mg) twice a day 03/04/23   Wertman, Sara E, PA-C  metoprolol  succinate (TOPROL -XL) 25 MG 24 hr tablet TAKE ONE-HALF (0.5) TABLET BY  MOUTH DAILY 05/31/23   Walker, Caitlin S, NP  nitroGLYCERIN  (NITROSTAT ) 0.4 MG SL tablet Place 1 tablet (0.4 mg total) under the tongue every 5 (five) minutes as needed for chest pain. 09/28/22   Vannie Reche RAMAN, NP  pantoprazole  (PROTONIX ) 40 MG tablet Take 40 mg by mouth daily as needed (acid reflux). 11/12/21   [provider]  rosuvastatin  (CRESTOR ) 40 MG tablet TAKE 1 TABLET BY MOUTH DAILY 12/28/22   Walker, Caitlin S, NP      Allergies    Latex    Review of Systems   Review of Systems A 10 point review of systems was performed and is negative unless otherwise reported in HPI.  Physical Exam Updated Vital Signs BP 98/69   Pulse 66   Temp (!) 97.5 F (36.4 C) (Oral)   Resp 19   SpO2 96%  Physical Exam General: Normal appearing male, lying in bed.  HEENT: PERRLA, Sclera anicteric, MMM, trachea midline.  Cardiology: RRR.  Resp: Normal respiratory rate and effort. Abd: Soft, non-distended. GU: Deferred. MSK: No peripheral edema or signs of trauma. Skin: warm, dry.  Neuro: A&Ox4, CNs II-XII grossly intact. Steady gait.  Psych: Normal mood and affect.   ED Results / Procedures / Treatments   Labs (all labs ordered are listed, but only abnormal results are displayed) Labs  Reviewed  COMPREHENSIVE METABOLIC PANEL WITH GFR - Abnormal; Notable for the following components:      Result Value   Potassium 3.4 (*)    CO2 20 (*)    Glucose, Bld 100 (*)    Calcium  8.3 (*)    Total Protein 6.1 (*)    All other components within normal limits  CBC - Abnormal; Notable for the following components:   RBC 4.03 (*)    HCT 38.4 (*)    All other components within normal limits  CBG MONITORING, ED - Abnormal; Notable for the following components:   Glucose-Capillary 124 (*)    All other components within normal limits  URINALYSIS, ROUTINE W REFLEX MICROSCOPIC  TROPONIN T, HIGH SENSITIVITY    EKG EKG Interpretation Date/Time:  Wednesday March 01 2024 20:12:45  EST Ventricular Rate:  65 PR Interval:  294 QRS Duration:  93 QT Interval:  391 QTC Calculation: 407 R Axis:   32  Text Interpretation: Sinus rhythm Prolonged PR interval Confirmed by Franklyn Gills [phone]) on 03/01/2024 8:46:20 PM  Radiology No results found.  Procedures Procedures    Medications Ordered in ED Medications - No data to display  ED Course/ Medical Decision Making/ A&P                          Medical Decision Making Amount and/or Complexity of Data Reviewed Labs: ordered.    This patient presents to the ED for concern of hypotension/syncope, this involves an extensive number of treatment options, and is a complaint that carries with it a high risk of complications and morbidity.  I considered the following differential and admission for this acute, potentially life threatening condition.   MDM:    Patient w/ hypotension/syncopal episode. Ddx includes vasovagal syncope/orthostasis, cardiogenic or obstructive shock, septic shock though that seems less likely. He has labs that were drawn in triage that show no anemia or leukocytosis, no significant electrolyte derangements other than K 3.4, no renal injury. He has normal HR and EKG that shows prior-existing 1st degree heart block. No significant hypo/hyperglycemia. Also considered ACS, infection, Urine not collected, troponin not collected yet. Patient states he wants to be discharged against medical advice.  I explained to the patient that without getting a full history, physical exam, further workup and treatment that could include IV fluids patient will need to be discharged AGAINST MEDICAL ADVICE.  I informed him that his risks of leaving AGAINST MEDICAL ADVICE include worsening of his condition that could include recurrent syncope, further hypotension, and worsening of the underlying condition that could result in serious harm up to and including death.  Patient and his wife both report understanding.  Patient states  he will simply drink fluids at home.  I encouraged him return to the ER.  Clinical Course as of 03/01/24 2209  Wed Mar 01, 2024  2008 BP: 98/69 [HN]  2121 Patient states that he was not given fluids right away.  He states he has been waiting for hours without any treatment.  Patient was indeed given fluids by EMS and has had labs drawn here.  His blood pressure has been in the 90s since arrival.  Patient does have an IV and I have offered him additional fluids but he states he does not want any treatment and he would rather be discharged.  I explained to the patient and his wife that he faces significant risks of leaving prior to treatment and workup being  complete including worsening of his condition up to and including death.  Patient agrees to these risks and demonstrates capacity.  Patient states he will go home and drink fluids.  I encouraged the patient to return to the ER.  Patient and his wife both report understanding and will be leaving AGAINST MEDICAL ADVICE. [HN]    Clinical Course User Index [HN] Franklyn Sid SAILOR, MD    Labs: I Ordered, and personally interpreted labs.  The pertinent results include:  those listed above  Additional history obtained from chart review, wife at bedside.   Social Determinants of Health: lives independently  Disposition:  AMA  Co morbidities that complicate the patient evaluation  Past Medical History:  Diagnosis Date   Allergic rhinitis    Borderline hyperlipidemia    Cataracts, bilateral    Hemorrhoids    Insomnia      Medicines No orders of the defined types were placed in this encounter.   I have reviewed the patients home medicines and have made adjustments as needed  Problem List / ED Course: Problem List Items Addressed This Visit   None Visit Diagnoses       Syncope and collapse    -  Primary     Hypotension, unspecified hypotension type                       This note was created using dictation software,  which may contain spelling or grammatical errors.    Franklyn Sid SAILOR, MD 03/01/24 2209

## 2024-03-01 NOTE — ED Triage Notes (Signed)
 Pt BIB [facility] from [facility] pt hypotensive and syncopal UPON EMS ARRIVAL. PT INITIAL PRESSURE 60/30. Ems administered 750 fluids, new pressure 80/50. Hx of bradycardia, 1st degree heart block.

## 2024-03-06 ENCOUNTER — Telehealth: Payer: Self-pay | Admitting: Cardiovascular Disease

## 2024-03-06 DIAGNOSIS — R55 Syncope and collapse: Secondary | ICD-10-CM

## 2024-03-06 NOTE — Telephone Encounter (Signed)
 Recommend mail 14 day live ZIO monitor.   Marcus Hopkins S Marcus Dubois, NP

## 2024-03-06 NOTE — Telephone Encounter (Signed)
 Spoke with patient and wife regarding recent ED visit They were at dinner with several other couples when patient told his wife they needed to leave and she needed to drive.  His head then slumped over and he became incoherent 911 called and was taken to ED Did leave AMA secondary to them being there for a very long time Since leaving has felt little weak and tired  Stated seems to have a hard time focusing at times Denies any chest pain/pressure,shortness of breath, or symptoms he had prior to stent placement  Walks 15-16 miles a week.  Has not been checking his blood pressure at home but did check last night, 120/68  Advised to monitor his blood pressure twice a day and call back end of week with readings.  Will get patient placed on cancellation list, keep 12/30 visit as scheduled   Will forward to Reche ORN NP and Dr Barbaraann for review

## 2024-03-06 NOTE — Telephone Encounter (Signed)
 Will y'all please help with scheduling Wednesday visit with Dr. Barbaraann? Thank you!  Barbaraann Avans S Estefano Victory, NP

## 2024-03-06 NOTE — Telephone Encounter (Signed)
 Order placed, left message to call back

## 2024-03-06 NOTE — Telephone Encounter (Signed)
 Pt had ED visit 12/10 after passing out in the shower due to low blood pressure. Hospital advised he be seen as soon as possible. Pt scheduled C. Walkers next available as requested, but feels 12/30 may not be soon enough. Please advise.

## 2024-03-07 ENCOUNTER — Ambulatory Visit

## 2024-03-07 DIAGNOSIS — R55 Syncope and collapse: Secondary | ICD-10-CM

## 2024-03-07 NOTE — Progress Notes (Unsigned)
 Enrolled for [employer] to mail a ZIO XT long term holter monitor to the patients address on file.   Dr. Flora Lipps to read.

## 2024-03-07 NOTE — Telephone Encounter (Signed)
 Reached patient, he is able to come in tomorrow morning with Dr. Barbaraann.  I overbooked at 9:00.  Pt aware to plan for 8:45 am appointment.  Appreciative for the appointment.

## 2024-03-07 NOTE — Progress Notes (Unsigned)
 Cardiology Office Note:  .   Date:  03/08/2024  ID:  Marcus Hopkins, DOB 18-May-1940, MRN [id number] PCP: Dwight Trula SQUIBB, MD  [facility] Providers Cardiologist:  Darryle ONEIDA Decent, MD Cardiology APP:  Vannie Reche RAMAN, NP   History of Present Illness: .    Chief Complaint  Patient presents with   Follow-up    Marcus Hopkins is a 83 y.o. male with below history who presents for follow-up.   History of Present Illness   Marcus Hopkins is an 83 year old male with coronary artery disease who presents for follow-up after a vasovagal episode.  He felt very weak without any pain and fainted, leaning forward onto the table while dining at a restaurant. Emergency services were called, and he was taken to [facility]. He felt okay after a short while and left the hospital early due to a long wait. He recalls a similar episode occurring approximately six years ago at another restaurant, where he did not require hospitalization.  On the day of the episode, he consumed only about six ounces of water and some decaffeinated coffee, acknowledging that his fluid intake was insufficient. He has since been trying to increase his fluid intake with flavored waters to prevent dehydration.  No chest discomfort or trouble breathing was experienced during the episode. He estimates that he was unconscious for a few minutes and did not experience any incontinence.  His current medications include clopidogrel , nitroglycerin  (which he does not use), amlodipine , and Crestor  40 mg daily. He was also taking metoprolol , which he takes as a half tablet at night.   He is walking up to 4 miles without CP or SOB.           Problem List CAD -PCI pLAD 01/2020 -ISR s/p POBA 06/2021 2. HLD -T chol 126, HDL 64, LDL 48, TG 71    ROS: All other ROS reviewed and negative. Pertinent positives noted in the HPI.     Studies Reviewed: SABRA       EKG 03/02/2024  NSR 65, no acute changes   LHC  06/24/2021   Dist LAD lesion is 75% stenosed.   Prox RCA-1 lesion is 25% stenosed.   Prox RCA-2 lesion is 25% stenosed.   3rd Sept lesion is 50% stenosed.   Mid LAD lesion is 95% stenosed.  This was in-stent restenosis of a 3.0 x 12 and 2.5 mm diameter stent placed in 2021, postdilated to 3.25 mm.  The in-stent restenosis began at the stent overlap and extended into the more distal stent from 2021.   Scoring balloon angioplasty was performed using a BALLN SCOREFLEX 3.0X15.  The distal stent was postdilated to 3.5 mm [place] balloon and the proximal stent was postdilated to 4 mm [place] balloon.  Intravascular ultrasound was used to optimize the angioplasty.   Post intervention, there is a 0% residual stenosis.   The left ventricular systolic function is normal.   LV end diastolic pressure is normal.   The left ventricular ejection fraction is 55-65% by visual estimate.   There is no aortic valve stenosis.   Successful scoring balloon angioplasty of the in-stent restenosis of the previously placed LAD stents.    LHC 03/04/2022   Mid LAD stent segment: Focal mid LAD lesion is 45% stenosed. - >  In-stent restenosis of the overlapping segment of the originally placed stents.  Nonsignificant by RFR and FFR.  Otherwise widely patent stent segment in the LAD.  Dist LAD lesion is 15% stenosed. 3rd Sept lesion is 25% stenosed. ->  Notably improved distal flow.   Prox RCA-1 lesion is 25% stenosed.  Prox RCA-2 lesion is 25% stenosed.   The left ventricular systolic function is normal.   LV end diastolic pressure is normal.    Physical Exam:   VS:  BP 112/72 (BP Location: Left Arm, Patient Position: Sitting)   Pulse 70   Ht 5' 9 (1.753 m)   Wt 160 lb 12.8 oz (72.9 kg)   SpO2 98%   BMI 23.75 kg/m    Wt Readings from Last 3 Encounters:  03/08/24 160 lb 12.8 oz (72.9 kg)  11/12/23 165 lb (74.8 kg)  09/28/22 160 lb (72.6 kg)    GEN: Well nourished, well developed in no acute distress NECK: No JVD; No  carotid bruits CARDIAC: RRR, no murmurs, rubs, gallops RESPIRATORY:  Clear to auscultation without rales, wheezing or rhonchi  ABDOMEN: Soft, non-tender, non-distended EXTREMITIES:  No edema; No deformity  ASSESSMENT AND PLAN: .   Assessment and Plan    Vasovagal syncope likely precipitated by dehydration Recent syncope likely due to dehydration from inadequate fluid intake. No arrhythmias suspected. - Increase fluid intake to 40-60 ounces of water daily. - Cancel heart monitor. - Schedule follow-up with PA in 3-4 months. - We will update his echo - continue norvasc  2.5 mg daily. Stop metoprolol  as could have contributed to episode.   Coronary artery disease with stable angina Coronary artery disease with previous PCI to prox LAD in 2021. Lipids controlled on Crestor . - Continue Crestor  40 mg daily. - On ASA/plavix  given history of stent ISR  Hyperlipidemia - Continue Crestor  40 mg daily.  Hypertension Managed with amlodipine . Metoprolol  deemed unnecessary. - Continue amlodipine  2.5 mg daily. - Discontinued metoprolol .                Follow-up: Return in about 3 months (around 06/06/2024).  Signed, Darryle DASEN. Barbaraann, MD, [facility]  [facility]  [address] [place], [place] [address] [phone]  9:14 AM

## 2024-03-08 ENCOUNTER — Ambulatory Visit: Attending: Cardiovascular Disease | Admitting: Cardiovascular Disease

## 2024-03-08 ENCOUNTER — Encounter: Payer: Self-pay | Admitting: Cardiovascular Disease

## 2024-03-08 VITALS — BP 112/72 | HR 70 | Ht 69.0 in | Wt 160.8 lb

## 2024-03-08 DIAGNOSIS — I25118 Atherosclerotic heart disease of native coronary artery with other forms of angina pectoris: Secondary | ICD-10-CM

## 2024-03-08 DIAGNOSIS — R9439 Abnormal result of other cardiovascular function study: Secondary | ICD-10-CM

## 2024-03-08 DIAGNOSIS — R55 Syncope and collapse: Secondary | ICD-10-CM

## 2024-03-08 DIAGNOSIS — E785 Hyperlipidemia, unspecified: Secondary | ICD-10-CM

## 2024-03-08 DIAGNOSIS — I200 Unstable angina: Secondary | ICD-10-CM

## 2024-03-08 DIAGNOSIS — R5383 Other fatigue: Secondary | ICD-10-CM

## 2024-03-08 NOTE — Telephone Encounter (Signed)
Patient was seen by Dr Flora Lipps today

## 2024-03-08 NOTE — Patient Instructions (Signed)
 Medication Instructions:  Your physician has recommended you make the following change in your medication:   -Stop taking metoprolol  succinate (Toprol -XL).  *If you need a refill on your cardiac medications before your next appointment, please call your pharmacy*  Testing/Procedures: Your physician has requested that you have an echocardiogram. Echocardiography is a painless test that uses sound waves to create images of your heart. It provides your doctor with information about the size and shape of your heart and how well your hearts chambers and valves are working. This procedure takes approximately one hour. There are no restrictions for this procedure. Please do NOT wear cologne, perfume, aftershave, or lotions (deodorant is allowed). Please arrive 15 minutes prior to your appointment time.  Please note: We ask at that you not bring children with you during ultrasound (echo/ vascular) testing. Due to room size and safety concerns, children are not allowed in the ultrasound rooms during exams. Our front office staff cannot provide observation of children in our lobby area while testing is being conducted. An adult accompanying a patient to their appointment will only be allowed in the ultrasound room at the discretion of the ultrasound technician under special circumstances. We apologize for any inconvenience.   Follow-Up: At [facility], you and your health needs are our priority.  As part of our continuing mission to provide you with exceptional heart care, our providers are all part of one team.  This team includes your primary Cardiologist (physician) and Advanced Practice Providers or APPs (Physician Assistants and Nurse Practitioners) who all work together to provide you with the care you need, when you need it.  Your next appointment:   3 month(s)  Provider:   One of our Advanced Practice Providers (APPs): Morse Clause, PA-C  Lamarr Satterfield, NP Miriam Shams,  NP  Olivia Pavy, PA-C Josefa Beauvais, NP  Leontine Salen, PA-C Orren Fabry, PA-C  [place], PA-C Ernest Dick, NP  Damien Braver, NP Jon Hails, PA-C  Waddell Donath, PA-C    Dayna Dunn, PA-C  Scott Weaver, PA-C Lum Louis, NP Katlyn West, NP Callie Goodrich, PA-C  Xika Zhao, NP Sheng Haley, PA-C    Kathleen Johnson, PA-C

## 2024-03-21 ENCOUNTER — Ambulatory Visit (HOSPITAL_BASED_OUTPATIENT_CLINIC_OR_DEPARTMENT_OTHER): Admitting: Family

## 2024-03-24 ENCOUNTER — Other Ambulatory Visit (HOSPITAL_BASED_OUTPATIENT_CLINIC_OR_DEPARTMENT_OTHER): Payer: Self-pay | Admitting: Family

## 2024-03-24 DIAGNOSIS — E785 Hyperlipidemia, unspecified: Secondary | ICD-10-CM

## 2024-03-24 DIAGNOSIS — I25118 Atherosclerotic heart disease of native coronary artery with other forms of angina pectoris: Secondary | ICD-10-CM

## 2024-06-07 ENCOUNTER — Ambulatory Visit (HOSPITAL_BASED_OUTPATIENT_CLINIC_OR_DEPARTMENT_OTHER): Admitting: Family

## 2024-06-07 ENCOUNTER — Other Ambulatory Visit (HOSPITAL_BASED_OUTPATIENT_CLINIC_OR_DEPARTMENT_OTHER)
